# Patient Record
Sex: Female | Born: 1950 | Race: Black or African American | Hispanic: No | Marital: Single | Smoking: Former smoker
Health system: Southern US, Community
[De-identification: ages and names within clinical notes are randomized; demographics above are authoritative.]

## PROBLEM LIST (undated history)

## (undated) DIAGNOSIS — Z91013 Allergy to seafood: Secondary | ICD-10-CM

## (undated) DIAGNOSIS — E0590 Thyrotoxicosis, unspecified without thyrotoxic crisis or storm: Secondary | ICD-10-CM

## (undated) DIAGNOSIS — J309 Allergic rhinitis, unspecified: Secondary | ICD-10-CM

## (undated) DIAGNOSIS — J45909 Unspecified asthma, uncomplicated: Secondary | ICD-10-CM

## (undated) HISTORY — DX: Unspecified asthma, uncomplicated: J45.909

## (undated) HISTORY — DX: Allergic rhinitis, unspecified: J30.9

## (undated) HISTORY — DX: Allergy to seafood: Z91.013

## (undated) HISTORY — DX: Thyrotoxicosis, unspecified without thyrotoxic crisis or storm: E05.90

## (undated) NOTE — *Deleted (*Deleted)
Asthma Continue Advair Diskus 500-50 -1 puff every 12 hours to prevent coughing or wheezing- name brand only, she failed generic Continue montelukast 10 mg once a day to prevent cough or wheeze Continue Spiriva Respimat 1.25 mcg  -2 puffs every morning to prevent cough and wheeze Use Ventolin 2 puffs every 4 hours if needed for wheezing or coughing or instead albuterol 0.083% 1 unit dose every 4 hours if needed Continue Xolair injections 150 mg once every 4 weeks and have access to an epinephrine device If your asthma is to flare you may start prednisone 10 mg taper given at office.  Take 1 tablet twice a day for 4 days then on the fifth day take 1 tablet and stop.  If you use this please call the clinic.  Allergic rhinitis Continue Allegra 60 mg- take one tablet once a day as needed for runny nose Continue Nasonex 1 spray per nostril twice a day if needed for stuffy nose Consider nasal saline rinses as needed for nasal symptoms Continue allergen avoidance measures  Allergic conjunctivitis Continue Optivar eye drops one drop in each eye twice a day as needed for red, itchy eyes.  Reflux Omeprazole 40 mg-take 1 capsule once a day for reflux Continue lifestyle modifications as listed below  Food allergy Continue to avoid shellfish.  If you have an allergic reaction give Benadryl 50 mg every 4 hours and if you has life-threatening symptoms inject with EpiPen 0.3 mg  Hives (urticaria)  . Cetirizine (Zyrtec) 10mg  twice a day and famotidine (Pepcid) 20 mg twice a day. If no symptoms for 7-14 days then decrease to. . Cetirizine (Zyrtec) 10mg  twice a day and famotidine (Pepcid) 20 mg once a day.  If no symptoms for 7-14 days then decrease to. . Cetirizine (Zyrtec) 10mg  twice a day.  If no symptoms for 7-14 days then decrease to. . Cetirizine (Zyrtec) 10mg  once a day.  May use Benadryl (diphenhydramine) as needed for breakthrough hives       If symptoms return, then step up dosage  Keep a  detailed symptom journal including foods eaten, contact with allergens, medications taken, weather changes.   Diabetes Keep track of how often you are using prednisone as this acan change your blood sugar control.   Call us if you are not doing well on this treatment plan  Continue on your other medications as listed in your chart  Follow up in the clinic in  3 months or sooner if needed   Lifestyle Changes for Controlling GERD When you have GERD, stomach acid feels as if it's backing up toward your mouth. Whether or not you take medication to control your GERD, your symptoms can often be improved with lifestyle changes.   Raise Your Head  Reflux is more likely to strike when you're lying down flat, because stomach fluid can  flow backward more easily. Raising the head of your bed 4-6 inches can help. To do this:  Slide blocks or books under the legs at the head of your bed. Or, place a wedge under  the mattress. Many foam stores can make a suitable wedge for you. The wedge  should run from your waist to the top of your head.  Don't just prop your head on several pillows. This increases pressure on your  stomach. It can make GERD worse.  Watch Your Eating Habits Certain foods may increase the acid in your stomach or relax the lower esophageal sphincter, making GERD more likely. It's best to  avoid the following:  Coffee, tea, and carbonated drinks (with and without caffeine)  Fatty, fried, or spicy food  Mint, chocolate, onions, and tomatoes  Any other foods that seem to irritate your stomach or cause you pain  Relieve the Pressure  Eat smaller meals, even if you have to eat more often.  Don't lie down right after you eat. Wait a few hours for your stomach to empty.  Avoid tight belts and tight-fitting clothes.  Lose excess weight.  Tobacco and Alcohol  Avoid smoking tobacco and drinking alcohol. They can make GERD symptoms worse.

---

## 2010-09-27 DEATH — deceased

## 2014-07-17 DIAGNOSIS — Z1211 Encounter for screening for malignant neoplasm of colon: Secondary | ICD-10-CM | POA: Insufficient documentation

## 2014-12-06 ENCOUNTER — Other Ambulatory Visit: Payer: Self-pay

## 2014-12-06 MED ORDER — OMALIZUMAB 150 MG ~~LOC~~ SOLR
150.0000 mg | SUBCUTANEOUS | Status: AC
  Administered 2015-01-20 – 2024-03-27 (×118): 150 mg via SUBCUTANEOUS

## 2015-01-20 ENCOUNTER — Ambulatory Visit (INDEPENDENT_AMBULATORY_CARE_PROVIDER_SITE_OTHER): Payer: Medicare Other | Admitting: *Deleted

## 2015-01-20 DIAGNOSIS — J4540 Moderate persistent asthma, uncomplicated: Secondary | ICD-10-CM

## 2015-02-18 ENCOUNTER — Ambulatory Visit (INDEPENDENT_AMBULATORY_CARE_PROVIDER_SITE_OTHER): Payer: Medicare Other | Admitting: *Deleted

## 2015-02-18 DIAGNOSIS — J4540 Moderate persistent asthma, uncomplicated: Secondary | ICD-10-CM

## 2015-03-18 ENCOUNTER — Ambulatory Visit (INDEPENDENT_AMBULATORY_CARE_PROVIDER_SITE_OTHER): Payer: Medicare Other | Admitting: *Deleted

## 2015-03-18 DIAGNOSIS — J4540 Moderate persistent asthma, uncomplicated: Secondary | ICD-10-CM

## 2015-04-17 ENCOUNTER — Ambulatory Visit (INDEPENDENT_AMBULATORY_CARE_PROVIDER_SITE_OTHER): Payer: Medicare Other | Admitting: *Deleted

## 2015-04-17 DIAGNOSIS — J4540 Moderate persistent asthma, uncomplicated: Secondary | ICD-10-CM | POA: Diagnosis not present

## 2015-05-13 ENCOUNTER — Ambulatory Visit (INDEPENDENT_AMBULATORY_CARE_PROVIDER_SITE_OTHER): Payer: Medicare Other

## 2015-05-13 DIAGNOSIS — J4540 Moderate persistent asthma, uncomplicated: Secondary | ICD-10-CM | POA: Diagnosis not present

## 2015-06-10 ENCOUNTER — Ambulatory Visit (INDEPENDENT_AMBULATORY_CARE_PROVIDER_SITE_OTHER): Payer: Medicare Other

## 2015-06-10 DIAGNOSIS — J4540 Moderate persistent asthma, uncomplicated: Secondary | ICD-10-CM | POA: Diagnosis not present

## 2015-07-08 ENCOUNTER — Ambulatory Visit (INDEPENDENT_AMBULATORY_CARE_PROVIDER_SITE_OTHER): Payer: Medicare Other

## 2015-07-08 DIAGNOSIS — J4540 Moderate persistent asthma, uncomplicated: Secondary | ICD-10-CM

## 2015-08-07 ENCOUNTER — Ambulatory Visit (INDEPENDENT_AMBULATORY_CARE_PROVIDER_SITE_OTHER): Payer: Medicare Other | Admitting: *Deleted

## 2015-08-07 DIAGNOSIS — J4540 Moderate persistent asthma, uncomplicated: Secondary | ICD-10-CM | POA: Diagnosis not present

## 2015-09-09 ENCOUNTER — Ambulatory Visit (INDEPENDENT_AMBULATORY_CARE_PROVIDER_SITE_OTHER): Payer: Medicare Other

## 2015-09-09 DIAGNOSIS — N183 Chronic kidney disease, stage 3 (moderate): Secondary | ICD-10-CM

## 2015-09-09 DIAGNOSIS — J4540 Moderate persistent asthma, uncomplicated: Secondary | ICD-10-CM | POA: Diagnosis not present

## 2015-09-09 DIAGNOSIS — E119 Type 2 diabetes mellitus without complications: Secondary | ICD-10-CM | POA: Insufficient documentation

## 2015-09-09 DIAGNOSIS — E039 Hypothyroidism, unspecified: Secondary | ICD-10-CM | POA: Insufficient documentation

## 2015-09-09 DIAGNOSIS — E1122 Type 2 diabetes mellitus with diabetic chronic kidney disease: Secondary | ICD-10-CM | POA: Insufficient documentation

## 2015-10-01 ENCOUNTER — Other Ambulatory Visit: Payer: Self-pay | Admitting: *Deleted

## 2015-10-01 MED ORDER — MONTELUKAST SODIUM 10 MG PO TABS: 10.0000 mg | ORAL_TABLET | Freq: Every day | ORAL | Status: DC

## 2015-10-07 ENCOUNTER — Ambulatory Visit (INDEPENDENT_AMBULATORY_CARE_PROVIDER_SITE_OTHER): Payer: Medicare Other

## 2015-10-07 DIAGNOSIS — J4540 Moderate persistent asthma, uncomplicated: Secondary | ICD-10-CM | POA: Diagnosis not present

## 2015-11-04 ENCOUNTER — Ambulatory Visit (INDEPENDENT_AMBULATORY_CARE_PROVIDER_SITE_OTHER): Payer: Medicare Other

## 2015-11-04 DIAGNOSIS — J4540 Moderate persistent asthma, uncomplicated: Secondary | ICD-10-CM

## 2015-12-02 ENCOUNTER — Ambulatory Visit (INDEPENDENT_AMBULATORY_CARE_PROVIDER_SITE_OTHER): Payer: Medicare Other

## 2015-12-02 ENCOUNTER — Telehealth: Payer: Self-pay | Admitting: Pediatrics

## 2015-12-02 ENCOUNTER — Other Ambulatory Visit: Payer: Self-pay | Admitting: Allergy

## 2015-12-02 DIAGNOSIS — J4540 Moderate persistent asthma, uncomplicated: Secondary | ICD-10-CM | POA: Diagnosis not present

## 2015-12-02 NOTE — Telephone Encounter (Signed)
TALKED TO PT ABOUT HER BILL

## 2015-12-02 NOTE — Telephone Encounter (Signed)
Patient has a concern about her MM (#[id number]) account balance. Please call her back at your convenience. Thanks

## 2015-12-12 ENCOUNTER — Other Ambulatory Visit: Payer: Self-pay

## 2015-12-12 MED ORDER — MONTELUKAST SODIUM 10 MG PO TABS: 10.0000 mg | ORAL_TABLET | Freq: Every day | ORAL | 0 refills | Status: DC

## 2015-12-12 NOTE — Telephone Encounter (Signed)
[facility], pt needs an OV

## 2015-12-12 NOTE — Telephone Encounter (Signed)
Patient called, has made an appointment for 01/01/16 so we will give her a 30 day supply on montelukast with no refills

## 2015-12-30 ENCOUNTER — Encounter: Payer: Self-pay | Admitting: Pediatrics

## 2015-12-30 ENCOUNTER — Ambulatory Visit: Payer: Medicare Other

## 2015-12-30 ENCOUNTER — Ambulatory Visit (INDEPENDENT_AMBULATORY_CARE_PROVIDER_SITE_OTHER): Payer: Medicare Other

## 2015-12-30 VITALS — BP 126/62 | HR 87 | Temp 97.7°F | Resp 20 | Ht 60.0 in

## 2015-12-30 DIAGNOSIS — J4550 Severe persistent asthma, uncomplicated: Secondary | ICD-10-CM | POA: Diagnosis not present

## 2015-12-30 DIAGNOSIS — J4551 Severe persistent asthma with (acute) exacerbation: Secondary | ICD-10-CM | POA: Insufficient documentation

## 2015-12-30 DIAGNOSIS — J3089 Other allergic rhinitis: Secondary | ICD-10-CM | POA: Insufficient documentation

## 2015-12-30 DIAGNOSIS — K219 Gastro-esophageal reflux disease without esophagitis: Secondary | ICD-10-CM | POA: Insufficient documentation

## 2015-12-30 DIAGNOSIS — J4540 Moderate persistent asthma, uncomplicated: Secondary | ICD-10-CM

## 2015-12-30 MED ORDER — FLUTICASONE-SALMETEROL 500-50 MCG/DOSE IN AEPB: 1.0000 | INHALATION_SPRAY | Freq: Two times a day (BID) | RESPIRATORY_TRACT | 3 refills | Status: DC

## 2015-12-30 MED ORDER — ALBUTEROL SULFATE HFA 108 (90 BASE) MCG/ACT IN AERS: 2.0000 | INHALATION_SPRAY | RESPIRATORY_TRACT | 2 refills | Status: DC | PRN

## 2015-12-30 MED ORDER — TIOTROPIUM BROMIDE MONOHYDRATE 1.25 MCG/ACT IN AERS: 2.0000 | INHALATION_SPRAY | RESPIRATORY_TRACT | 5 refills | Status: DC

## 2015-12-30 MED ORDER — MONTELUKAST SODIUM 10 MG PO TABS
10.0000 mg | ORAL_TABLET | Freq: Every day | ORAL | 3 refills | Status: DC
Start: 2015-12-30 — End: 2017-01-25

## 2015-12-30 MED ORDER — OMEPRAZOLE 40 MG PO CPDR: 40.0000 mg | DELAYED_RELEASE_CAPSULE | Freq: Every day | ORAL | 5 refills | Status: DC

## 2015-12-30 MED ORDER — AZELASTINE HCL 0.05 % OP SOLN: 1.0000 [drp] | Freq: Two times a day (BID) | OPHTHALMIC | 5 refills | Status: DC | PRN

## 2015-12-30 MED ORDER — MOMETASONE FUROATE 50 MCG/ACT NA SUSP: 1.0000 | Freq: Two times a day (BID) | NASAL | 5 refills | Status: DC | PRN

## 2015-12-30 MED ORDER — ALBUTEROL SULFATE (2.5 MG/3ML) 0.083% IN NEBU: 2.5000 mg | INHALATION_SOLUTION | RESPIRATORY_TRACT | 3 refills | Status: DC | PRN

## 2015-12-30 NOTE — Progress Notes (Signed)
[address] [place] [address] Dept: [phone]  FOLLOW UP NOTE  Patient ID: Jennifer Pruitt, female    DOB: 1950/09/20  Age: 65 y.o. MRN: [id number] Date of Office Visit: 12/30/2015  Assessment  Chief Complaint: Asthma and Allergies  HPI Jennifer Pruitt presents for follow-up of asthma and allergic rhinitis.. She is on Xolair every 4 weeks and feels that it has been lifesaving. Her asthma is under control and better than in the past. Her nasal symptoms are under control. Her diabetes is well controlled. Her gastroesophageal reflux is well controlled  Current medications are outlined in the chart and will be summarized in the after visit summary.   Drug Allergies:  Allergies  Allergen Reactions  . Shellfish Allergy   . Methimazole Rash and Swelling    HIVES, caused lips to swell    Physical Exam: BP 126/62   Pulse 87   Temp 97.7 F (36.5 C) (Oral)   Resp 20   Ht 5' (1.524 m)   SpO2 97%    Physical Exam  Constitutional: She is oriented to person, place, and time. She appears well-developed and well-nourished.  HENT:  Eyes normal. Ears normal. Nose normal. Pharynx normal.  Neck: Neck supple.  Cardiovascular:  S1 and S2 normal no murmurs  Pulmonary/Chest:  Clear to percussion auscultation except for some decreased breath sounds at the bases  Lymphadenopathy:    She has no cervical adenopathy.  Neurological: She is alert and oriented to person, place, and time.  Psychiatric: She has a normal mood and affect. Her behavior is normal. Judgment and thought content normal.  Vitals reviewed.   Diagnostics:  FVC 1.33 L FEV1 0.64 L. Predicted FVC 2.01 L predicted FEV1 1.55 L-this shows a mild reduction in the forced vital capacity and a moderate reduction in the FEV1-these values are stable for her  Assessment and Plan: 1. Severe persistent asthma without complication   2. Other allergic rhinitis   3. Gastroesophageal reflux disease without esophagitis     4.      Adult-onset diabetes well controlled  Meds ordered this encounter  Medications  . montelukast (SINGULAIR) 10 MG tablet    Sig: Take 1 tablet (10 mg total) by mouth at bedtime.    Dispense:  90 tablet    Refill:  3  . Fluticasone-Salmeterol (ADVAIR DISKUS) 500-50 MCG/DOSE AEPB    Sig: Inhale 1 puff into the lungs 2 (two) times daily.    Dispense:  3 each    Refill:  3  . albuterol (PROVENTIL) (2.5 MG/3ML) 0.083% nebulizer solution    Sig: Take 3 mLs (2.5 mg total) by nebulization every 4 (four) hours as needed for wheezing or shortness of breath.    Dispense:  75 mL    Refill:  3  . albuterol (VENTOLIN HFA) 108 (90 Base) MCG/ACT inhaler    Sig: Inhale 2 puffs into the lungs every 4 (four) hours as needed for wheezing or shortness of breath.    Dispense:  1 Inhaler    Refill:  2  . mometasone (NASONEX) 50 MCG/ACT nasal spray    Sig: Place 1 spray into the nose 2 (two) times daily as needed.    Dispense:  17 g    Refill:  5  . omeprazole (PRILOSEC) 40 MG capsule    Sig: Take 1 capsule (40 mg total) by mouth daily.    Dispense:  30 capsule    Refill:  5  . azelastine (OPTIVAR) 0.05 %  ophthalmic solution    Sig: Place 1 drop into both eyes 2 (two) times daily as needed.    Dispense:  6 mL    Refill:  5  . Tiotropium Bromide Monohydrate (SPIRIVA RESPIMAT) 1.25 MCG/ACT AERS    Sig: Inhale 2 puffs into the lungs every morning.    Dispense:  1 Inhaler    Refill:  5    Patient Instructions  Zyrtec 10 mg once a day if needed for runny nose Nasonex 1 spray per nostril twice a day if needed for stuffy nose Montelukast  10 mg once a day for coughing or wheezing Advair Diskus 500s/50 one puff every 12 hours for coughing or wheezing Ventolin 2 puffs every 4 hours if needed for wheezing or coughing spells Albuterol 0.083% one unit dose every 4 hours if needed for cough or wheeze Omeprazole 40 mg once a day  for acid reflux Azelastine 0.05% one drop twice a day if needed for  itchy eyes Spiriva Respimat  1.25 mg-2 puffs every morning for coughing or wheezing Continue Xolair every month She should have a flu vaccination Call me if you are not doing well on this treatment plan   Return in about 6 months (around 06/29/2016).    Thank you for the opportunity to care for this patient.  Please do not hesitate to contact me with questions.  Tonette BihariJ. A. Tex Conroy, M.D.  [facility] [address] [place], [place] [address] [phone]

## 2015-12-30 NOTE — Patient Instructions (Addendum)
Zyrtec 10 mg once a day if needed for runny nose Nasonex 1 spray per nostril twice a day if needed for stuffy nose Montelukast  10 mg once a day for coughing or wheezing Advair Diskus 500s/50 one puff every 12 hours for coughing or wheezing Ventolin 2 puffs every 4 hours if needed for wheezing or coughing spells Albuterol 0.083% one unit dose every 4 hours if needed for cough or wheeze Omeprazole 40 mg once a day  for acid reflux Azelastine 0.05% one drop twice a day if needed for itchy eyes Spiriva Respimat  1.25 mg-2 puffs every morning for coughing or wheezing Continue Xolair every month She should have a flu vaccination Call me if you are not doing well on this treatment plan

## 2016-01-27 ENCOUNTER — Ambulatory Visit (INDEPENDENT_AMBULATORY_CARE_PROVIDER_SITE_OTHER): Payer: Medicare Other

## 2016-01-27 DIAGNOSIS — J4540 Moderate persistent asthma, uncomplicated: Secondary | ICD-10-CM

## 2016-02-12 ENCOUNTER — Other Ambulatory Visit: Payer: Self-pay

## 2016-02-12 MED ORDER — ALBUTEROL SULFATE HFA 108 (90 BASE) MCG/ACT IN AERS: 2.0000 | INHALATION_SPRAY | RESPIRATORY_TRACT | 3 refills | Status: DC | PRN

## 2016-02-24 ENCOUNTER — Ambulatory Visit (INDEPENDENT_AMBULATORY_CARE_PROVIDER_SITE_OTHER): Payer: Medicare Other

## 2016-02-24 DIAGNOSIS — J4540 Moderate persistent asthma, uncomplicated: Secondary | ICD-10-CM | POA: Diagnosis not present

## 2016-03-15 ENCOUNTER — Telehealth: Payer: Self-pay

## 2016-03-15 NOTE — Telephone Encounter (Deleted)
Patient would like a letter stating her disability for jury duty. She states that it takes her a little longer to get ready in the morning due to her coughing and wheezing spells. Please call when letter is ready @ [phone]

## 2016-03-15 NOTE — Telephone Encounter (Signed)
Per Damita. Patient came by clinic.  Patient would like a letter stating her disability for jury duty. She states, it takes her longer to get ready in the morning due to her coughing and wheezing spells. Please call when letter is ready at [phone]

## 2016-03-16 NOTE — Telephone Encounter (Signed)
Note typed, Dr. Beaulah DinningBardelas signed and mailed to patient.

## 2016-03-16 NOTE — Telephone Encounter (Signed)
Please type a note as follows  Jennifer Pruitt has severe persistent unstable asthma. She should be excused from jury duty. With any exposure to dust, perfumes, colognes or smoke she develops coughing spells.

## 2016-03-16 NOTE — Telephone Encounter (Signed)
Please type a note as follows  Jennifer Pruitt has severe persistent unstable asthma. She should be excused from jury duty. With any exposure to dust, perfumes, colognes or smoke she develops coughing spells.  

## 2016-03-24 ENCOUNTER — Ambulatory Visit (INDEPENDENT_AMBULATORY_CARE_PROVIDER_SITE_OTHER): Payer: Medicare Other | Admitting: *Deleted

## 2016-03-24 DIAGNOSIS — J4540 Moderate persistent asthma, uncomplicated: Secondary | ICD-10-CM | POA: Diagnosis not present

## 2016-04-20 ENCOUNTER — Ambulatory Visit (INDEPENDENT_AMBULATORY_CARE_PROVIDER_SITE_OTHER): Payer: Medicare Other

## 2016-04-20 DIAGNOSIS — J4540 Moderate persistent asthma, uncomplicated: Secondary | ICD-10-CM | POA: Diagnosis not present

## 2016-05-18 ENCOUNTER — Ambulatory Visit: Payer: Medicare Other

## 2016-05-20 ENCOUNTER — Ambulatory Visit (INDEPENDENT_AMBULATORY_CARE_PROVIDER_SITE_OTHER): Payer: Medicare Other

## 2016-05-20 DIAGNOSIS — J4540 Moderate persistent asthma, uncomplicated: Secondary | ICD-10-CM

## 2016-06-15 ENCOUNTER — Ambulatory Visit (INDEPENDENT_AMBULATORY_CARE_PROVIDER_SITE_OTHER): Payer: Medicare Other

## 2016-06-15 DIAGNOSIS — J4540 Moderate persistent asthma, uncomplicated: Secondary | ICD-10-CM | POA: Diagnosis not present

## 2016-07-13 ENCOUNTER — Ambulatory Visit (INDEPENDENT_AMBULATORY_CARE_PROVIDER_SITE_OTHER): Payer: Medicare Other

## 2016-07-13 DIAGNOSIS — J4540 Moderate persistent asthma, uncomplicated: Secondary | ICD-10-CM

## 2016-08-10 ENCOUNTER — Ambulatory Visit (INDEPENDENT_AMBULATORY_CARE_PROVIDER_SITE_OTHER): Payer: Medicare Other

## 2016-08-10 DIAGNOSIS — J4540 Moderate persistent asthma, uncomplicated: Secondary | ICD-10-CM

## 2016-09-07 ENCOUNTER — Ambulatory Visit: Payer: Self-pay

## 2016-09-07 ENCOUNTER — Ambulatory Visit (INDEPENDENT_AMBULATORY_CARE_PROVIDER_SITE_OTHER): Payer: Medicare Other

## 2016-09-07 DIAGNOSIS — J4540 Moderate persistent asthma, uncomplicated: Secondary | ICD-10-CM

## 2016-10-05 ENCOUNTER — Ambulatory Visit (INDEPENDENT_AMBULATORY_CARE_PROVIDER_SITE_OTHER): Payer: Medicare Other

## 2016-10-05 DIAGNOSIS — J4540 Moderate persistent asthma, uncomplicated: Secondary | ICD-10-CM

## 2016-10-21 ENCOUNTER — Other Ambulatory Visit: Payer: Self-pay | Admitting: *Deleted

## 2016-10-21 MED ORDER — OMALIZUMAB 150 MG ~~LOC~~ SOLR: 150.0000 mg | SUBCUTANEOUS | 11 refills | Status: DC

## 2016-10-26 DIAGNOSIS — E119 Type 2 diabetes mellitus without complications: Secondary | ICD-10-CM | POA: Insufficient documentation

## 2016-10-26 DIAGNOSIS — H40053 Ocular hypertension, bilateral: Secondary | ICD-10-CM | POA: Insufficient documentation

## 2016-11-02 ENCOUNTER — Ambulatory Visit (INDEPENDENT_AMBULATORY_CARE_PROVIDER_SITE_OTHER): Payer: Medicare Other

## 2016-11-02 DIAGNOSIS — J4540 Moderate persistent asthma, uncomplicated: Secondary | ICD-10-CM | POA: Diagnosis not present

## 2016-11-30 ENCOUNTER — Ambulatory Visit: Payer: Self-pay

## 2016-12-03 ENCOUNTER — Ambulatory Visit (INDEPENDENT_AMBULATORY_CARE_PROVIDER_SITE_OTHER): Payer: Medicare Other | Admitting: *Deleted

## 2016-12-03 DIAGNOSIS — J4540 Moderate persistent asthma, uncomplicated: Secondary | ICD-10-CM | POA: Diagnosis not present

## 2016-12-21 DIAGNOSIS — D649 Anemia, unspecified: Secondary | ICD-10-CM | POA: Insufficient documentation

## 2016-12-28 ENCOUNTER — Ambulatory Visit (INDEPENDENT_AMBULATORY_CARE_PROVIDER_SITE_OTHER): Payer: Medicare Other

## 2016-12-28 DIAGNOSIS — J4540 Moderate persistent asthma, uncomplicated: Secondary | ICD-10-CM | POA: Diagnosis not present

## 2017-01-25 ENCOUNTER — Other Ambulatory Visit: Payer: Self-pay | Admitting: Allergy

## 2017-01-25 ENCOUNTER — Other Ambulatory Visit: Payer: Self-pay

## 2017-01-25 ENCOUNTER — Ambulatory Visit (INDEPENDENT_AMBULATORY_CARE_PROVIDER_SITE_OTHER): Payer: Medicare Other | Admitting: *Deleted

## 2017-01-25 DIAGNOSIS — J4540 Moderate persistent asthma, uncomplicated: Secondary | ICD-10-CM | POA: Diagnosis not present

## 2017-01-25 MED ORDER — MONTELUKAST SODIUM 10 MG PO TABS: 10.0000 mg | ORAL_TABLET | Freq: Every day | ORAL | 0 refills | Status: DC

## 2017-01-25 MED ORDER — FLUTICASONE-SALMETEROL 500-50 MCG/DOSE IN AEPB: 1.0000 | INHALATION_SPRAY | Freq: Two times a day (BID) | RESPIRATORY_TRACT | 1 refills | Status: DC

## 2017-01-25 NOTE — Telephone Encounter (Signed)
Declined montelukast 10 mg pt. Needs office visit.

## 2017-02-22 ENCOUNTER — Ambulatory Visit (INDEPENDENT_AMBULATORY_CARE_PROVIDER_SITE_OTHER): Payer: Medicare Other

## 2017-02-22 ENCOUNTER — Other Ambulatory Visit: Payer: Self-pay

## 2017-02-22 ENCOUNTER — Ambulatory Visit (INDEPENDENT_AMBULATORY_CARE_PROVIDER_SITE_OTHER): Payer: Medicare Other | Admitting: Pediatrics

## 2017-02-22 ENCOUNTER — Encounter: Payer: Self-pay | Admitting: Pediatrics

## 2017-02-22 VITALS — BP 138/68 | HR 80 | Temp 97.7°F | Resp 16 | Ht 62.0 in | Wt 208.4 lb

## 2017-02-22 DIAGNOSIS — K219 Gastro-esophageal reflux disease without esophagitis: Secondary | ICD-10-CM | POA: Diagnosis not present

## 2017-02-22 DIAGNOSIS — T7800XD Anaphylactic reaction due to unspecified food, subsequent encounter: Secondary | ICD-10-CM

## 2017-02-22 DIAGNOSIS — J4550 Severe persistent asthma, uncomplicated: Secondary | ICD-10-CM

## 2017-02-22 DIAGNOSIS — I10 Essential (primary) hypertension: Secondary | ICD-10-CM | POA: Diagnosis not present

## 2017-02-22 DIAGNOSIS — J4540 Moderate persistent asthma, uncomplicated: Secondary | ICD-10-CM | POA: Diagnosis not present

## 2017-02-22 DIAGNOSIS — T7800XA Anaphylactic reaction due to unspecified food, initial encounter: Secondary | ICD-10-CM | POA: Insufficient documentation

## 2017-02-22 DIAGNOSIS — J3089 Other allergic rhinitis: Secondary | ICD-10-CM

## 2017-02-22 MED ORDER — FLUTICASONE-SALMETEROL 500-50 MCG/DOSE IN AEPB: 1.0000 | INHALATION_SPRAY | Freq: Two times a day (BID) | RESPIRATORY_TRACT | 1 refills | Status: DC

## 2017-02-22 MED ORDER — MOMETASONE FUROATE 50 MCG/ACT NA SUSP: 1.0000 | Freq: Two times a day (BID) | NASAL | 5 refills | Status: DC | PRN

## 2017-02-22 MED ORDER — OMEPRAZOLE 40 MG PO CPDR: DELAYED_RELEASE_CAPSULE | ORAL | 5 refills | Status: DC

## 2017-02-22 MED ORDER — AZELASTINE HCL 0.05 % OP SOLN: 1.0000 [drp] | Freq: Two times a day (BID) | OPHTHALMIC | 5 refills | Status: DC | PRN

## 2017-02-22 MED ORDER — MONTELUKAST SODIUM 10 MG PO TABS: 10.0000 mg | ORAL_TABLET | Freq: Every day | ORAL | 3 refills | Status: DC

## 2017-02-22 MED ORDER — MONTELUKAST SODIUM 10 MG PO TABS: 10.0000 mg | ORAL_TABLET | Freq: Every day | ORAL | 0 refills | Status: DC

## 2017-02-22 MED ORDER — TIOTROPIUM BROMIDE MONOHYDRATE 1.25 MCG/ACT IN AERS: 2.0000 | INHALATION_SPRAY | RESPIRATORY_TRACT | 5 refills | Status: DC

## 2017-02-22 MED ORDER — ALBUTEROL SULFATE HFA 108 (90 BASE) MCG/ACT IN AERS: 2.0000 | INHALATION_SPRAY | RESPIRATORY_TRACT | 3 refills | Status: DC | PRN

## 2017-02-22 MED ORDER — EPINEPHRINE 0.3 MG/0.3ML IJ SOAJ: 0.3000 mg | Freq: Once | INTRAMUSCULAR | 1 refills | Status: AC

## 2017-02-22 NOTE — Patient Instructions (Addendum)
Zyrtec 10 mg-take 1 tablet once a day if needed for runny nose Nasonex 1 spray per nostril twice a day if needed for stuffy nose Montelukast  10 mg once a day for coughing or wheezing Advair Diskus 500/50-1 puff every 12 hours for coughing or wheezing Ventolin 2 puffs every 4 hours if needed for wheezing or coughing spells or instead albuterol 0.083% one unit dose every 4 hours if needed Omeprazole 40 mg once a day for acid reflux Azelastine 0.05% one drop twice a day if needed for itchy eyes Spiriva Respimat  1.25 mg-2 puffs every morning to help her breathing Continue Xolair every month every 4 weeks Continue on your other medications Continue avoiding shellfish and have Benadryl and EpiPen available in case of an allergic reaction

## 2017-02-22 NOTE — Progress Notes (Signed)
[address] [place] [address] Dept: [phone]  FOLLOW UP NOTE  Patient ID: Jennifer Pruitt, female    DOB: 1950/06/04  Age: 66 y.o. MRN: [id number] Date of Office Visit: 02/22/2017  Assessment  Chief Complaint: Asthma  HPI Jennifer Pruitt presents for follow-up of asthma and allergic rhinitis. Her asthma has been well controlled. She feels that Xolair every 4 weeks has been lifesaving. Her nasal symptoms are well controlled. Her diabetes is well controlled. Her gastroesophageal reflux is well controlled. She continues to avoid shellfish  Current medications are outlined in the chart.   Drug Allergies:  Allergies  Allergen Reactions  . Shellfish Allergy   . Methimazole Rash and Swelling    HIVES, caused lips to swell    Physical Exam: BP 138/68   Pulse 80   Temp 97.7 F (36.5 C) (Oral)   Resp 16   Ht 5\' 2"  (1.575 m)   Wt 208 lb 6.4 oz (94.5 kg)   SpO2 96%   BMI 38.12 kg/m    Physical Exam  Constitutional: She is oriented to person, place, and time. She appears well-developed and well-nourished.  HENT:  Eyes normal. Ears normal. Nose normal. Pharynx normal.  Neck: Neck supple.  Cardiovascular:  S1 and S2 normal no murmurs  Pulmonary/Chest:  She had an increased anteroposterior diameter. Her lungs were clear to percussion and auscultation except for some decreased breath sounds at the bases  Lymphadenopathy:    She has no cervical adenopathy.  Neurological: She is alert and oriented to person, place, and time.  Psychiatric: She has a normal mood and affect. Her behavior is normal. Judgment and thought content normal.  Vitals reviewed.   Diagnostics:  FVC 1.28 L FEV1 0.67 L. Predicted FVC 2.19 L predicted FEV1 1.70 L-this shows a moderate reduction in the forced vital capacity and FEV1  Assessment and Plan: 1. Severe persistent asthma without complication   2. Gastroesophageal reflux disease without esophagitis   3. Other allergic rhinitis     4. Essential hypertension   5. Anaphylactic shock due to food, subsequent encounter     Meds ordered this encounter  Medications  . albuterol (PROAIR HFA) 108 (90 Base) MCG/ACT inhaler    Sig: Inhale 2 puffs into the lungs every 4 (four) hours as needed for wheezing or shortness of breath.    Dispense:  1 Inhaler    Refill:  3  . azelastine (OPTIVAR) 0.05 % ophthalmic solution    Sig: Place 1 drop into both eyes 2 (two) times daily as needed.    Dispense:  6 mL    Refill:  5  . Fluticasone-Salmeterol (ADVAIR DISKUS) 500-50 MCG/DOSE AEPB    Sig: Inhale 1 puff into the lungs 2 (two) times daily.    Dispense:  1 each    Refill:  1  . mometasone (NASONEX) 50 MCG/ACT nasal spray    Sig: Place 1 spray into the nose 2 (two) times daily as needed.    Dispense:  17 g    Refill:  5  . montelukast (SINGULAIR) 10 MG tablet    Sig: Take 1 tablet (10 mg total) by mouth at bedtime.    Dispense:  30 tablet    Refill:  0    No additional refills until pt apt  . Tiotropium Bromide Monohydrate (SPIRIVA RESPIMAT) 1.25 MCG/ACT AERS    Sig: Inhale 2 puffs into the lungs every morning.    Dispense:  1 Inhaler    Refill:  5  . EPINEPHrine 0.3 mg/0.3 mL IJ SOAJ injection    Sig: Inject 0.3 mLs (0.3 mg total) into the muscle once for 1 dose.    Dispense:  0.3 mL    Refill:  1  . omeprazole (PRILOSEC) 40 MG capsule    Sig: 40mg  once a day for acid refluz    Dispense:  30 capsule    Refill:  5    Patient Instructions  Zyrtec 10 mg-take 1 tablet once a day if needed for runny nose Nasonex 1 spray per nostril twice a day if needed for stuffy nose Montelukast  10 mg once a day for coughing or wheezing Advair Diskus 500/50-1 puff every 12 hours for coughing or wheezing Ventolin 2 puffs every 4 hours if needed for wheezing or coughing spells or instead albuterol 0.083% one unit dose every 4 hours if needed Omeprazole 40 mg once a day for acid reflux Azelastine 0.05% one drop twice a day if needed  for itchy eyes Spiriva Respimat  1.25 mg-2 puffs every morning to help her breathing Continue Xolair every month every 4 weeks Continue on your other medications Continue avoiding shellfish and have Benadryl and EpiPen available in case of an allergic reaction   Return in about 6 months (around 08/22/2017).    Thank you for the opportunity to care for this patient.  Please do not hesitate to contact me with questions.  Tonette BihariJ. A. Shekita Boyden, M.D.  [facility] [address] [place], [place] [address] [phone]

## 2017-02-24 ENCOUNTER — Telehealth: Payer: Self-pay | Admitting: Allergy and Immunology

## 2017-02-24 MED ORDER — MONTELUKAST SODIUM 10 MG PO TABS: 10.0000 mg | ORAL_TABLET | Freq: Every day | ORAL | 3 refills | Status: DC

## 2017-02-24 NOTE — Telephone Encounter (Addendum)
Wants 90 days supply on montelukast 10 mg with 4 refills.  Ok per Dr. Beaulah DinningBardelas. Sent in rx.  Patient notified.

## 2017-02-24 NOTE — Telephone Encounter (Signed)
Please call patient back regarding her meds that were sent to pharmacy.

## 2017-03-24 ENCOUNTER — Ambulatory Visit (INDEPENDENT_AMBULATORY_CARE_PROVIDER_SITE_OTHER): Payer: Medicare Other | Admitting: *Deleted

## 2017-03-24 DIAGNOSIS — J4540 Moderate persistent asthma, uncomplicated: Secondary | ICD-10-CM | POA: Diagnosis not present

## 2017-03-25 ENCOUNTER — Ambulatory Visit: Payer: Self-pay

## 2017-04-19 ENCOUNTER — Ambulatory Visit (INDEPENDENT_AMBULATORY_CARE_PROVIDER_SITE_OTHER): Payer: Medicare Other

## 2017-04-19 DIAGNOSIS — J4540 Moderate persistent asthma, uncomplicated: Secondary | ICD-10-CM | POA: Diagnosis not present

## 2017-05-17 ENCOUNTER — Ambulatory Visit (INDEPENDENT_AMBULATORY_CARE_PROVIDER_SITE_OTHER): Payer: Medicare Other

## 2017-05-17 DIAGNOSIS — J4540 Moderate persistent asthma, uncomplicated: Secondary | ICD-10-CM | POA: Diagnosis not present

## 2017-06-14 ENCOUNTER — Ambulatory Visit: Payer: Medicare Other

## 2017-06-16 ENCOUNTER — Ambulatory Visit: Payer: Self-pay

## 2017-06-17 ENCOUNTER — Ambulatory Visit (INDEPENDENT_AMBULATORY_CARE_PROVIDER_SITE_OTHER): Payer: Medicare Other | Admitting: *Deleted

## 2017-06-17 DIAGNOSIS — J4550 Severe persistent asthma, uncomplicated: Secondary | ICD-10-CM

## 2017-07-12 ENCOUNTER — Ambulatory Visit (INDEPENDENT_AMBULATORY_CARE_PROVIDER_SITE_OTHER): Payer: Medicare Other

## 2017-07-12 DIAGNOSIS — J4540 Moderate persistent asthma, uncomplicated: Secondary | ICD-10-CM | POA: Diagnosis not present

## 2017-08-09 ENCOUNTER — Ambulatory Visit (INDEPENDENT_AMBULATORY_CARE_PROVIDER_SITE_OTHER): Payer: Medicare Other

## 2017-08-09 DIAGNOSIS — J4540 Moderate persistent asthma, uncomplicated: Secondary | ICD-10-CM | POA: Diagnosis not present

## 2017-08-23 ENCOUNTER — Ambulatory Visit (INDEPENDENT_AMBULATORY_CARE_PROVIDER_SITE_OTHER): Payer: Medicare Other | Admitting: Pediatrics

## 2017-08-23 ENCOUNTER — Encounter: Payer: Self-pay | Admitting: Pediatrics

## 2017-08-23 VITALS — BP 134/64 | HR 88 | Temp 97.8°F | Resp 20 | Ht 62.0 in | Wt 206.0 lb

## 2017-08-23 DIAGNOSIS — J4550 Severe persistent asthma, uncomplicated: Secondary | ICD-10-CM

## 2017-08-23 DIAGNOSIS — J3089 Other allergic rhinitis: Secondary | ICD-10-CM

## 2017-08-23 DIAGNOSIS — T7800XD Anaphylactic reaction due to unspecified food, subsequent encounter: Secondary | ICD-10-CM

## 2017-08-23 DIAGNOSIS — K219 Gastro-esophageal reflux disease without esophagitis: Secondary | ICD-10-CM | POA: Diagnosis not present

## 2017-08-23 DIAGNOSIS — I10 Essential (primary) hypertension: Secondary | ICD-10-CM

## 2017-08-23 MED ORDER — MONTELUKAST SODIUM 10 MG PO TABS: 10.0000 mg | ORAL_TABLET | Freq: Every day | ORAL | 3 refills | Status: DC

## 2017-08-23 MED ORDER — TIOTROPIUM BROMIDE MONOHYDRATE 1.25 MCG/ACT IN AERS: 2.0000 | INHALATION_SPRAY | RESPIRATORY_TRACT | 2 refills | Status: DC

## 2017-08-23 MED ORDER — ALBUTEROL SULFATE HFA 108 (90 BASE) MCG/ACT IN AERS: 2.0000 | INHALATION_SPRAY | RESPIRATORY_TRACT | 1 refills | Status: DC | PRN

## 2017-08-23 MED ORDER — FLUTICASONE-SALMETEROL 500-50 MCG/DOSE IN AEPB: INHALATION_SPRAY | RESPIRATORY_TRACT | 2 refills | Status: DC

## 2017-08-23 NOTE — Progress Notes (Signed)
[address] [place] [address] Dept: [phone]  FOLLOW UP NOTE  Patient ID: Jennifer Pruitt, female    DOB: [birth year]  Age: 67 y.o. MRN: [id number] Date of Office Visit: 08/23/2017  Assessment  Chief Complaint: Wheezing; Breathing Problem (hot weather making breathing difficult); and Medication Refill  HPI Jennifer Pruitt presents for follow-up of asthma and allergic rhinitis. She is on Xolair every 4 weeks and feels that it has been lifesaving.Her nasal symptoms are controlled. Her diabetes is well controlled She continues to avoid shellfish.  Gastroesophageal reflux is well controlled. She has some shortness of breath when it  is hot and humid outside like it has been in the past few days.   Drug Allergies:  Allergies  Allergen Reactions  . Tapazole [Methimazole] Swelling and Rash    HIVES, caused lips to swell  . Iodine Hives and Swelling  . Shellfish Allergy     Physical Exam: BP 134/64 (BP Location: Left Arm, Patient Position: Sitting, Cuff Size: Large)   Pulse 88   Temp 97.8 F (36.6 C) (Oral)   Resp 20   Ht  (1.575 m)   Wt 206 lb (93.4 kg)   SpO2 95%   BMI 37.68 kg/m    Physical Exam  Constitutional: She is oriented to person, place, and time. She appears well-developed and well-nourished.  HENT:  Eyes normal. Ears normal. Nose normal. Pharynx normal.  Neck: Neck supple.  Pulmonary/Chest:  Increased anteroposterior diameter. Clear to percussion and auscultation except for some decreased breath sounds at the bases  Lymphadenopathy:    She has no cervical adenopathy.  Neurological: She is alert and oriented to person, place, and time.  Psychiatric: She has a normal mood and affect. Her behavior is normal. Judgment and thought content normal.  Vitals reviewed.   Diagnostics:  FVC 1.51 L FEV1 0.80 L. Predicted FVC 2.19 L predicted FEV1 1.70 L-This shows a mild reduction in the forced vital capacity and a moderate reduction in the  FEV1  Assessment and Plan: 1. Severe persistent asthma, unspecified whether complicated   2. Anaphylactic shock due to food, subsequent encounter   3. Gastroesophageal reflux disease without esophagitis   4. Essential hypertension   5. Other allergic rhinitis   6 .    Type 2 diabetes with no insulin need  Meds ordered this encounter  Medications  . Fluticasone-Salmeterol (ADVAIR DISKUS) 500-50 MCG/DOSE AEPB    Sig: One puff every 12 hours to prevent cough or wheeze. Rinse, gargle and spit after use.    Dispense:  3 each    Refill:  2    Dispense 90 days supply  . albuterol (PROAIR HFA) 108 (90 Base) MCG/ACT inhaler    Sig: Inhale 2 puffs into the lungs every 4 (four) hours as needed for wheezing or shortness of breath.    Dispense:  3 Inhaler    Refill:  1    Dispense 90 days supply  . Tiotropium Bromide Monohydrate (SPIRIVA RESPIMAT) 1.25 MCG/ACT AERS    Sig: Inhale 2 puffs into the lungs every morning.    Dispense:  3 Inhaler    Refill:  2    Dispense 90 days supply  . montelukast (SINGULAIR) 10 MG tablet    Sig: Take 1 tablet (10 mg total) by mouth at bedtime.    Dispense:  90 tablet    Refill:  3    Dispense 90 days supply.    Patient Instructions  Zyrtec 10 mg-take 1  tablet once a day if needed for runny nose Nasonex 1 spray per nostril twice a day if needed for stuffy nose Montelukast 10 mg-take 1 tablet once a day to prevent coughing or wheezing Advair Diskus 500/50-1 puff every 12 hours to prevent coughing or wheezing Ventolin 2 puffs every 4 hours if needed for wheezing or coughing spells or instead albuterol 0.083% one unit dose every 4 hours if needed Omeprazole 40 mg once a day for acid reflux Azelastine's 0.05% one drop twice a day if needed for itchy eyes Spiriva Respimat 1.25 mg - 2 puffs every morning to help with her breathing Continue Xolair every 4 weeks CBC with differential to see how much eosinophilia she has Continue on your other medications Call  me if you're not doing well on this treatment plan  Continue avoiding shellfish. If you have an allergic reaction take Benadryl 50 mg every 4 hours and if you have life-threatening symptoms inject  with EpiPen 0.3 mg   Return in about 6 months (around 02/23/2018).    Thank you for the opportunity to care for this patient.  Please do not hesitate to contact me with questions.  Tonette Bihari, M.D.  [facility] [address] [place], [place] [address] [phone]

## 2017-08-23 NOTE — Patient Instructions (Addendum)
Zyrtec 10 mg-take 1 tablet once a day if needed for runny nose Nasonex 1 spray per nostril twice a day if needed for stuffy nose Montelukast 10 mg-take 1 tablet once a day to prevent coughing or wheezing Advair Diskus 500/50-1 puff every 12 hours to prevent coughing or wheezing Ventolin 2 puffs every 4 hours if needed for wheezing or coughing spells or instead albuterol 0.083% one unit dose every 4 hours if needed Omeprazole 40 mg once a day for acid reflux Azelastine's 0.05% one drop twice a day if needed for itchy eyes Spiriva Respimat 1.25 mg - 2 puffs every morning to help with her breathing Continue Xolair every 4 weeks CBC with differential to see how much eosinophilia she has Continue on your other medications Call me if you're not doing well on this treatment plan  Continue avoiding shellfish. If you have an allergic reaction take Benadryl 50 mg every 4 hours and if you have life-threatening symptoms inject  with EpiPen 0.3 mg

## 2017-08-26 LAB — CBC WITH DIFFERENTIAL/PLATELET
Basophils Absolute: 0 10*3/uL (ref 0.0–0.2)
Basos: 0 %
EOS (ABSOLUTE): 0 10*3/uL (ref 0.0–0.4)
Eos: 0 %
Hematocrit: 34 % (ref 34.0–46.6)
Hemoglobin: 10.7 g/dL — ABNORMAL LOW (ref 11.1–15.9)
IMMATURE GRANS (ABS): 0 10*3/uL (ref 0.0–0.1)
IMMATURE GRANULOCYTES: 0 %
LYMPHS: 47 %
Lymphocytes Absolute: 2.4 10*3/uL (ref 0.7–3.1)
MCH: 25.1 pg — ABNORMAL LOW (ref 26.6–33.0)
MCHC: 31.5 g/dL (ref 31.5–35.7)
MCV: 80 fL (ref 79–97)
Monocytes Absolute: 0.3 10*3/uL (ref 0.1–0.9)
Monocytes: 5 %
Neutrophils Absolute: 2.4 10*3/uL (ref 1.4–7.0)
Neutrophils: 48 %
PLATELETS: 279 10*3/uL (ref 150–450)
RBC: 4.26 x10E6/uL (ref 3.77–5.28)
RDW: 17 % — ABNORMAL HIGH (ref 12.3–15.4)
WBC: 5 10*3/uL (ref 3.4–10.8)

## 2017-09-06 ENCOUNTER — Ambulatory Visit (INDEPENDENT_AMBULATORY_CARE_PROVIDER_SITE_OTHER): Payer: Medicare Other

## 2017-09-06 DIAGNOSIS — J4540 Moderate persistent asthma, uncomplicated: Secondary | ICD-10-CM

## 2017-10-04 ENCOUNTER — Ambulatory Visit (INDEPENDENT_AMBULATORY_CARE_PROVIDER_SITE_OTHER): Payer: Medicare Other

## 2017-10-04 DIAGNOSIS — J4540 Moderate persistent asthma, uncomplicated: Secondary | ICD-10-CM

## 2017-10-19 ENCOUNTER — Other Ambulatory Visit: Payer: Self-pay | Admitting: *Deleted

## 2017-10-19 MED ORDER — OMALIZUMAB 150 MG ~~LOC~~ SOLR: 150.0000 mg | SUBCUTANEOUS | 11 refills | Status: DC

## 2017-11-01 ENCOUNTER — Ambulatory Visit (INDEPENDENT_AMBULATORY_CARE_PROVIDER_SITE_OTHER): Payer: Medicare Other

## 2017-11-01 DIAGNOSIS — J4540 Moderate persistent asthma, uncomplicated: Secondary | ICD-10-CM

## 2017-11-29 ENCOUNTER — Ambulatory Visit (INDEPENDENT_AMBULATORY_CARE_PROVIDER_SITE_OTHER): Payer: Medicare Other | Admitting: *Deleted

## 2017-11-29 DIAGNOSIS — J4550 Severe persistent asthma, uncomplicated: Secondary | ICD-10-CM

## 2017-12-27 ENCOUNTER — Ambulatory Visit (INDEPENDENT_AMBULATORY_CARE_PROVIDER_SITE_OTHER): Payer: Medicare Other

## 2017-12-27 DIAGNOSIS — J4540 Moderate persistent asthma, uncomplicated: Secondary | ICD-10-CM

## 2018-01-30 ENCOUNTER — Encounter: Payer: Self-pay | Admitting: Pediatrics

## 2018-01-30 ENCOUNTER — Ambulatory Visit: Payer: Medicare Other | Admitting: Pediatrics

## 2018-01-30 ENCOUNTER — Ambulatory Visit: Payer: Medicare Other

## 2018-01-30 VITALS — BP 110/70 | HR 92 | Temp 97.8°F | Resp 18

## 2018-01-30 DIAGNOSIS — J4550 Severe persistent asthma, uncomplicated: Secondary | ICD-10-CM | POA: Diagnosis not present

## 2018-01-30 DIAGNOSIS — J3089 Other allergic rhinitis: Secondary | ICD-10-CM | POA: Diagnosis not present

## 2018-01-30 DIAGNOSIS — T7800XD Anaphylactic reaction due to unspecified food, subsequent encounter: Secondary | ICD-10-CM

## 2018-01-30 DIAGNOSIS — I10 Essential (primary) hypertension: Secondary | ICD-10-CM

## 2018-01-30 DIAGNOSIS — J4540 Moderate persistent asthma, uncomplicated: Secondary | ICD-10-CM

## 2018-01-30 DIAGNOSIS — K219 Gastro-esophageal reflux disease without esophagitis: Secondary | ICD-10-CM | POA: Diagnosis not present

## 2018-01-30 MED ORDER — FLUTICASONE-SALMETEROL 500-50 MCG/DOSE IN AEPB: INHALATION_SPRAY | RESPIRATORY_TRACT | 2 refills | Status: DC

## 2018-01-30 MED ORDER — TIOTROPIUM BROMIDE MONOHYDRATE 1.25 MCG/ACT IN AERS: 2.0000 | INHALATION_SPRAY | RESPIRATORY_TRACT | 2 refills | Status: DC

## 2018-01-30 MED ORDER — MONTELUKAST SODIUM 10 MG PO TABS: 10.0000 mg | ORAL_TABLET | Freq: Every day | ORAL | 3 refills | Status: DC

## 2018-01-30 MED ORDER — MOMETASONE FUROATE 50 MCG/ACT NA SUSP: 1.0000 | Freq: Two times a day (BID) | NASAL | 5 refills | Status: DC | PRN

## 2018-01-30 MED ORDER — ALBUTEROL SULFATE HFA 108 (90 BASE) MCG/ACT IN AERS: 2.0000 | INHALATION_SPRAY | RESPIRATORY_TRACT | 1 refills | Status: DC | PRN

## 2018-01-30 MED ORDER — OMEPRAZOLE 40 MG PO CPDR
DELAYED_RELEASE_CAPSULE | ORAL | 5 refills | Status: DC
Start: 2018-01-30 — End: 2019-02-21

## 2018-01-30 NOTE — Progress Notes (Signed)
[address] [place] [address] Dept: [phone]  FOLLOW UP NOTE  Patient ID: Jennifer Pruitt, female    DOB: 02/11/1951  Age: 67 y.o. MRN: [id number] Date of Office Visit: 01/30/2018  Assessment  Chief Complaint: Asthma  HPI STEVEN VEAZIE presents for follow-up of severe persistent asthma. ,  allergic rhinitis and food allergies.  She continues to avoid shellfish.  Her diabetes is well controlled without insulin For the past month she tried generic Advair Diskus 500/50 and her asthma has not been well controlled.  She is having to use more albuterol.  She is on Spiriva Respimat 1.25-2 puffs every 24  hours and montelukast 10 mg once a day.  She continues to use omeprazole 40 mg once a day and this is keeping her heartburn under control .  She did not have any eosinophils noted on her last CBC with differential Other current medications are outlined in the chart   Drug Allergies:  Allergies  Allergen Reactions  . Tapazole [Methimazole] Swelling and Rash    HIVES, caused lips to swell  . Iodine Hives and Swelling  . Shellfish Allergy   . Wixela Inhub [Fluticasone-Salmeterol]     Failed trial of    Physical Exam: BP 110/70   Pulse 92   Temp 97.8 F (36.6 C) (Oral)   Resp 18   SpO2 95%    Physical Exam  Constitutional: She is oriented to person, place, and time. She appears well-developed and well-nourished.  HENT:  Eyes normal.  Ears normal.  Nose normal.  Pharynx normal.  Neck: Neck supple.  Cardiovascular:  S1-S2 normal no murmurs  Pulmonary/Chest:  Increased anteroposterior diameter.  Decreased breath sounds at the bases  Lymphadenopathy:    She has no cervical adenopathy.  Neurological: She is alert and oriented to person, place, and time.  Psychiatric: She has a normal mood and affect. Her behavior is normal. Judgment and thought content normal.  Vitals reviewed.   Diagnostics: FVC 1.20 L FEV1 0.68 L.  Predicted FVC 2.16 L predicted FEV1 1.67  L-this shows a moderate reduction in the forced vital capacity and the FEV1 .  Her best FVC has been around 1.50 L and her best FEV1 around 0.80 L  Assessment and Plan: 1. Severe persistent asthma without complication   2. Anaphylactic shock due to food, subsequent encounter   3. Gastroesophageal reflux disease without esophagitis   4. Other allergic rhinitis   5. Essential hypertension     Meds ordered this encounter  Medications  . albuterol (PROAIR HFA) 108 (90 Base) MCG/ACT inhaler    Sig: Inhale 2 puffs into the lungs every 4 (four) hours as needed for wheezing or shortness of breath.    Dispense:  3 Inhaler    Refill:  1    Dispense 90 days supply  . Fluticasone-Salmeterol (ADVAIR DISKUS) 500-50 MCG/DOSE AEPB    Sig: One puff every 12 hours to prevent cough or wheeze. Rinse, gargle and spit after use.    Dispense:  3 each    Refill:  2    Dispense 90 days supply BRAND ONLY  . mometasone (NASONEX) 50 MCG/ACT nasal spray    Sig: Place 1 spray into the nose 2 (two) times daily as needed.    Dispense:  17 g    Refill:  5  . montelukast (SINGULAIR) 10 MG tablet    Sig: Take 1 tablet (10 mg total) by mouth at bedtime.    Dispense:  90  tablet    Refill:  3    Dispense 90 days supply.  Marland Kitchen omeprazole (PRILOSEC) 40 MG capsule    Sig: 40mg  once a day for acid refluz    Dispense:  30 capsule    Refill:  5  . Tiotropium Bromide Monohydrate (SPIRIVA RESPIMAT) 1.25 MCG/ACT AERS    Sig: Inhale 2 puffs into the lungs every morning.    Dispense:  3 Inhaler    Refill:  2    Dispense 90 days supply    Patient Instructions  Advair Diskus 500-50 -1 puff every 12 hours to prevent coughing or wheezing- name brand only, she failed generic Ventolin 2 puffs every 4 hours if needed for wheezing or coughing spells or instead albuterol 0.083% 1 unit dose every 4 hours if needed Spiriva Respimat 1.25 mcg  -2 puffs every morning to help with her breathing Continue Xolair every 4 weeks Zyrtec 10  mg-take 1 tablet once a day if needed for runny nose Nasonex 1 spray per nostril twice a day if needed for stuffy nose Omeprazole 40 mg-take 1 capsule once a day for reflux Call us if you are not doing well on this treatment plan Continue on your other medications  Avoid shellfish.  If she has an allergic reaction give Benadryl 50 mg every 4 hours and if she has life-threatening symptoms inject with EpiPen 0.3 mg   Return in about 6 months (around 07/31/2018).    Thank you for the opportunity to care for this patient.  Please do not hesitate to contact me with questions.  Tonette Bihari, M.D.  [facility] [address] [place], [place] [address] [phone]

## 2018-01-30 NOTE — Patient Instructions (Addendum)
Advair Diskus 500-50 -1 puff every 12 hours to prevent coughing or wheezing- name brand only, she failed generic Ventolin 2 puffs every 4 hours if needed for wheezing or coughing spells or instead albuterol 0.083% 1 unit dose every 4 hours if needed Spiriva Respimat 1.25 mcg  -2 puffs every morning to help with her breathing Continue Xolair every 4 weeks Zyrtec 10 mg-take 1 tablet once a day if needed for runny nose Nasonex 1 spray per nostril twice a day if needed for stuffy nose Omeprazole 40 mg-take 1 capsule once a day for reflux Call us if you are not doing well on this treatment plan Continue on your other medications  Avoid shellfish.  If she has an allergic reaction give Benadryl 50 mg every 4 hours and if she has life-threatening symptoms inject with EpiPen 0.3 mg

## 2018-02-28 ENCOUNTER — Ambulatory Visit (INDEPENDENT_AMBULATORY_CARE_PROVIDER_SITE_OTHER): Payer: Medicare Other

## 2018-02-28 DIAGNOSIS — J4540 Moderate persistent asthma, uncomplicated: Secondary | ICD-10-CM

## 2018-03-28 ENCOUNTER — Ambulatory Visit (INDEPENDENT_AMBULATORY_CARE_PROVIDER_SITE_OTHER): Payer: Medicare Other

## 2018-03-28 DIAGNOSIS — J4540 Moderate persistent asthma, uncomplicated: Secondary | ICD-10-CM | POA: Diagnosis not present

## 2018-04-25 ENCOUNTER — Ambulatory Visit (INDEPENDENT_AMBULATORY_CARE_PROVIDER_SITE_OTHER): Payer: Medicare Other

## 2018-04-25 DIAGNOSIS — J4540 Moderate persistent asthma, uncomplicated: Secondary | ICD-10-CM

## 2018-05-23 ENCOUNTER — Ambulatory Visit (INDEPENDENT_AMBULATORY_CARE_PROVIDER_SITE_OTHER): Payer: Medicare Other

## 2018-05-23 ENCOUNTER — Telehealth: Payer: Self-pay

## 2018-05-23 DIAGNOSIS — J4540 Moderate persistent asthma, uncomplicated: Secondary | ICD-10-CM

## 2018-05-23 NOTE — Telephone Encounter (Signed)
Pt. Was in to receive her xolair injection today. Pt. Stated she had her appointment back in November and that Dr. Beaulah Dinning usually gives her a baby pak to keep on hand for when there is weather changes that sometimes can trigger her asthma so I asked Dr. Beaulah Dinning and he ok'd to give pt. A baby pak.

## 2018-05-24 NOTE — Telephone Encounter (Signed)
Okay to give to the patient

## 2018-06-20 ENCOUNTER — Ambulatory Visit: Payer: Self-pay

## 2018-06-20 ENCOUNTER — Ambulatory Visit (INDEPENDENT_AMBULATORY_CARE_PROVIDER_SITE_OTHER): Payer: Medicare Other

## 2018-06-20 DIAGNOSIS — J4540 Moderate persistent asthma, uncomplicated: Secondary | ICD-10-CM

## 2018-07-03 ENCOUNTER — Encounter: Payer: Self-pay | Admitting: Family Medicine

## 2018-07-03 ENCOUNTER — Ambulatory Visit (INDEPENDENT_AMBULATORY_CARE_PROVIDER_SITE_OTHER): Payer: Medicare Other | Admitting: Family Medicine

## 2018-07-03 ENCOUNTER — Other Ambulatory Visit: Payer: Self-pay

## 2018-07-03 DIAGNOSIS — K219 Gastro-esophageal reflux disease without esophagitis: Secondary | ICD-10-CM | POA: Diagnosis not present

## 2018-07-03 DIAGNOSIS — J3089 Other allergic rhinitis: Secondary | ICD-10-CM | POA: Diagnosis not present

## 2018-07-03 DIAGNOSIS — T7800XD Anaphylactic reaction due to unspecified food, subsequent encounter: Secondary | ICD-10-CM | POA: Diagnosis not present

## 2018-07-03 DIAGNOSIS — H1010 Acute atopic conjunctivitis, unspecified eye: Secondary | ICD-10-CM

## 2018-07-03 DIAGNOSIS — J4551 Severe persistent asthma with (acute) exacerbation: Secondary | ICD-10-CM

## 2018-07-03 MED ORDER — PREDNISONE 10 MG PO TABS: ORAL_TABLET | ORAL | 0 refills | Status: DC

## 2018-07-03 MED ORDER — CETIRIZINE HCL 10 MG PO TABS: 10.0000 mg | ORAL_TABLET | Freq: Every day | ORAL | 3 refills | Status: DC

## 2018-07-03 NOTE — Progress Notes (Signed)
RE: Jennifer Pruitt MRN: [id number] DOB: 1951-02-26 Date of Telemedicine Visit: 07/03/2018  Referring provider: Lyndel Safe., MD Primary care provider: Lyndel Safe., MD  Chief Complaint: Asthma   Telemedicine Follow Up Visit via Telephone: I connected with Jennifer Pruitt for a follow up on 07/03/18 by telephone and verified that I am speaking with the correct person using two identifiers.   I discussed the limitations, risks, security and privacy concerns of performing an evaluation and management service by telephone and the availability of in person appointments. I also discussed with the patient that there may be a patient responsible charge related to this service. The patient expressed understanding and agreed to proceed.  Patient is at home. Provider is at the office.  Visit start time: 11:34 Visit end time: 10:01 Insurance consent/check in by: Rod Can Medical consent and medical assistant/nurse: Damits Smith  History of Present Illness: She is a 68 y.o. female, who is being followed for asthma, allergic rhinitis, reflux, food allergy with anaphylactic reaction to shellfish, and hypertension. Her previous allergy office visit was on 01/30/2018 with Dr. Beaulah Dinning. At today's visit, she reports her asthma has not been well controlled for the last 7 days with symptoms including shortness of breath and wheeze. She denies cough and fever. She is currently using Advair Diskus 500-50 one puff every 12 hours, Spiriva 1.25- 2 puffs once a day, montelukast 10 mg once a day, Xolair 150 mg injection once every 28 days, and over the last week she has been using her albuterol 2-3 times a day. She reports allergic rhinitis as well controlled with Nasonex and Zyrtec. She reports her eyes are red and itchy and she has recently started using Optivar with relief of symptoms. Her current medications are listed in the chart.    Assessment and Plan: [name] is a 68 y.o. female  with:  Asthma Prednisone 10 mg tablets. Take 2 tablets twice a day for 3 days, then take 2 tablets once a day for 1 day, then take 1 tablet on the 5th day, then stop Continue Advair Diskus 500-50 -1 puff every 12 hours to prevent coughing or wheezing- name brand only, she failed generic Ventolin 2 puffs every 4 hours if needed for wheezing or coughing or instead albuterol 0.083% 1 unit dose every 4 hours if needed Spiriva Respimat 1.25 mcg  -2 puffs every morning to prevent cough and wheeze Continue Xolair 150 mg once every 4 weeks  Allergic rhinitis Zyrtec 10 mg-take 1 tablet once a day if needed for runny nose Nasonex 1 spray per nostril twice a day if needed for stuffy nose Consider nasal saline rinses as needed for nasal symptoms Continue allergen avoidance measures  Allergic conjunctivitis Continue Optivar eye drops one drop in each eye twice a day as needed for red, itchy eyes.  Reflux Omeprazole 40 mg-take 1 capsule once a day for reflux Continue lifestyle modifications as listed below  Food allergy Continue to avoid shellfish.  If you have an allergic reaction give Benadryl 50 mg every 4 hours and if you has life-threatening symptoms inject with EpiPen 0.3 mg  Call us if you are not doing well on this treatment plan  Continue on your other medications as listed in your chart  Follow up in the clinic in 2 months or sooner if needed  Return in about 2 months (around 09/02/2018), or if symptoms worsen or fail to improve.  Meds ordered this encounter  Medications   predniSONE (DELTASONE) 10  MG tablet    Sig: 20 mg tablets twice a day for 3 days, then 20 mg on day 4, then 10 mg tablet on day 5    Dispense:  15 tablet    Refill:  0   cetirizine (ZYRTEC ALLERGY) 10 MG tablet    Sig: Take 1 tablet (10 mg total) by mouth at bedtime.    Dispense:  90 tablet    Refill:  3    Dispense 90 day supply   Lab Orders  No laboratory test(s) ordered today     Diagnostics: None.  Medication List:  Current Outpatient Medications  Medication Sig Dispense Refill   albuterol (PROAIR HFA) 108 (90 Base) MCG/ACT inhaler Inhale 2 puffs into the lungs every 4 (four) hours as needed for wheezing or shortness of breath. 3 Inhaler 1   albuterol (PROVENTIL) (2.5 MG/3ML) 0.083% nebulizer solution Take 3 mLs (2.5 mg total) by nebulization every 4 (four) hours as needed for wheezing or shortness of breath. 75 mL 3   azelastine (OPTIVAR) 0.05 % ophthalmic solution Place 1 drop into both eyes 2 (two) times daily as needed. 6 mL 5   Cholecalciferol (VITAMIN D-1000 MAX ST) 1000 units tablet Take by mouth.     Fe Fum-FA-B Cmp-C-Zn-Mg-Mn-Cu (HEMATINIC PLUS VIT/MINERALS) 106-1 MG TABS Take by mouth.     Fluticasone-Salmeterol (ADVAIR DISKUS) 500-50 MCG/DOSE AEPB One puff every 12 hours to prevent cough or wheeze. Rinse, gargle and spit after use. 3 each 2   glimepiride (AMARYL) 4 MG tablet 4 mg.     IRON PO Take by mouth.     levothyroxine (SYNTHROID, LEVOTHROID) 125 MCG tablet TAKE 1 TABLET BY MOUTH DAILY     lisinopril (PRINIVIL,ZESTRIL) 40 MG tablet 40 mg.     metFORMIN (GLUCOPHAGE) 500 MG tablet Take 500 mg by mouth.     mometasone (NASONEX) 50 MCG/ACT nasal spray Place 1 spray into the nose 2 (two) times daily as needed. 17 g 5   montelukast (SINGULAIR) 10 MG tablet Take 1 tablet (10 mg total) by mouth at bedtime. 90 tablet 3   omalizumab (XOLAIR) 150 MG injection Inject 150 mg into the skin every 28 (twenty-eight) days. 1 each 11   omeprazole (PRILOSEC) 40 MG capsule 40mg  once a day for acid refluz 30 capsule 5   Tiotropium Bromide Monohydrate (SPIRIVA RESPIMAT) 1.25 MCG/ACT AERS Inhale 2 puffs into the lungs every morning. 3 Inhaler 2   cetirizine (ZYRTEC ALLERGY) 10 MG tablet Take 1 tablet (10 mg total) by mouth at bedtime. 90 tablet 3   furosemide (LASIX) 40 MG tablet 40 mg.     Ipratropium-Albuterol (COMBIVENT RESPIMAT) 20-100 MCG/ACT  AERS respimat Inhale into the lungs.     predniSONE (DELTASONE) 10 MG tablet 20 mg tablets twice a day for 3 days, then 20 mg on day 4, then 10 mg tablet on day 5 15 tablet 0   Current Facility-Administered Medications  Medication Dose Route Frequency Provider Last Rate Last Dose   omalizumab Geoffry Paradise) injection 150 mg  150 mg Subcutaneous Q28 days Stephannie Li A, MD   150 mg at 06/20/18 1118   Allergies: Allergies  Allergen Reactions   Tapazole [Methimazole] Swelling and Rash    HIVES, caused lips to swell   Iodine Hives and Swelling   Shellfish Allergy    Wixela Inhub [Fluticasone-Salmeterol]     Failed trial of   I reviewed her past medical history, social history, family history, and environmental history and no significant changes  have been reported from previous visit on 01/30/2018.  Review of Systems  Constitutional: Negative for fever.  HENT: Positive for congestion.   Eyes: Positive for itching.  Respiratory: Positive for shortness of breath and wheezing.   Cardiovascular: Negative.   Gastrointestinal: Negative.   Musculoskeletal: Negative.   Neurological: Negative.   Psychiatric/Behavioral: Negative.    Objective: Physical Exam Not obtained as encounter was done via telephone.   Previous notes and tests were reviewed.  I discussed the assessment and treatment plan with the patient. The patient was provided an opportunity to ask questions and all were answered. The patient agreed with the plan and demonstrated an understanding of the instructions.   The patient was advised to call back or seek an in-person evaluation if the symptoms worsen or if the condition fails to improve as anticipated.  I provided 27 minutes of non-face-to-face time during this encounter.  It was my pleasure to participate in [place] care today. Please feel free to contact me with any questions or concerns.   Sincerely,  Thermon Leyland, FNP

## 2018-07-03 NOTE — Patient Instructions (Addendum)
Asthma Prednisone 10 mg tablets. Take 2 tablets twice a day for 3 days, then take 2 tablets once a day for 1 day, then take 1 tablet on the 5th day, then stop Continue Advair Diskus 500-50 -1 puff every 12 hours to prevent coughing or wheezing- name brand only, she failed generic Ventolin 2 puffs every 4 hours if needed for wheezing or coughing or instead albuterol 0.083% 1 unit dose every 4 hours if needed Spiriva Respimat 1.25 mcg  -2 puffs every morning to prevent cough and wheeze Continue Xolair 150 mg once every 4 weeks  Allergic rhinitis Zyrtec 10 mg-take 1 tablet once a day if needed for runny nose Nasonex 1 spray per nostril twice a day if needed for stuffy nose Consider nasal saline rinses as needed for nasal symptoms Continue allergen avoidance measures  Allergic conjunctivitis Continue Optivar eye drops one drop in each eye twice a day as needed for red, itchy eyes.  Reflux Omeprazole 40 mg-take 1 capsule once a day for reflux Continue lifestyle modifications as listed below  Food allergy Continue to avoid shellfish.  If you have an allergic reaction give Benadryl 50 mg every 4 hours and if you has life-threatening symptoms inject with EpiPen 0.3 mg  Call us if you are not doing well on this treatment plan  Continue on your other medications as listed in your chart  Follow up in the clinic in 2 months or sooner if needed   Lifestyle Changes for Controlling GERD When you have GERD, stomach acid feels as if it's backing up toward your mouth. Whether or not you take medication to control your GERD, your symptoms can often be improved with lifestyle changes.   Raise Your Head  Reflux is more likely to strike when you're lying down flat, because stomach fluid can  flow backward more easily. Raising the head of your bed 4-6 inches can help. To do this:  Slide blocks or books under the legs at the head of your bed. Or, place a wedge under  the mattress. Many foam stores  can make a suitable wedge for you. The wedge  should run from your waist to the top of your head.  Don't just prop your head on several pillows. This increases pressure on your  stomach. It can make GERD worse.  Watch Your Eating Habits Certain foods may increase the acid in your stomach or relax the lower esophageal sphincter, making GERD more likely. It's best to avoid the following:  Coffee, tea, and carbonated drinks (with and without caffeine)  Fatty, fried, or spicy food  Mint, chocolate, onions, and tomatoes  Any other foods that seem to irritate your stomach or cause you pain  Relieve the Pressure  Eat smaller meals, even if you have to eat more often.  Don't lie down right after you eat. Wait a few hours for your stomach to empty.  Avoid tight belts and tight-fitting clothes.  Lose excess weight.  Tobacco and Alcohol  Avoid smoking tobacco and drinking alcohol. They can make GERD symptoms worse.

## 2018-07-18 ENCOUNTER — Ambulatory Visit: Payer: Self-pay

## 2018-07-20 ENCOUNTER — Telehealth: Payer: Self-pay | Admitting: *Deleted

## 2018-07-20 NOTE — Telephone Encounter (Signed)
Only if she hasnt given permission to ship for the year.  They have to have it each month if they dont have permission to ship.

## 2018-07-20 NOTE — Telephone Encounter (Signed)
Pt calling regarding xolair- pt spoke with specialty pharmacy to give permission to ship and they told her they would call our office to verify delivery. We do not have her xolair in the office, her xolair apt was for 4/21. Pt wondering if this a new policy that pharmacy has to speak with [place] every month for delivery? Tammy please advise.

## 2018-07-21 NOTE — Telephone Encounter (Signed)
*  for the rest of the year*

## 2018-07-21 NOTE — Telephone Encounter (Signed)
Pt xolair arrived this morning- pt scheduled for xolair apt 4/28 next week.

## 2018-07-21 NOTE — Telephone Encounter (Signed)
She is still on track for delivery today if someone can reach out to he when it comes n to make her appt for injection

## 2018-07-21 NOTE — Telephone Encounter (Signed)
She stated that she gave pharm permission to ship.

## 2018-07-25 ENCOUNTER — Ambulatory Visit (INDEPENDENT_AMBULATORY_CARE_PROVIDER_SITE_OTHER): Payer: Medicare Other

## 2018-07-25 ENCOUNTER — Other Ambulatory Visit: Payer: Self-pay

## 2018-07-25 DIAGNOSIS — J4540 Moderate persistent asthma, uncomplicated: Secondary | ICD-10-CM

## 2018-07-25 DIAGNOSIS — J4551 Severe persistent asthma with (acute) exacerbation: Secondary | ICD-10-CM

## 2018-08-22 ENCOUNTER — Ambulatory Visit (INDEPENDENT_AMBULATORY_CARE_PROVIDER_SITE_OTHER): Payer: Medicare Other

## 2018-08-22 ENCOUNTER — Other Ambulatory Visit: Payer: Self-pay

## 2018-08-22 DIAGNOSIS — J4551 Severe persistent asthma with (acute) exacerbation: Secondary | ICD-10-CM

## 2018-08-22 DIAGNOSIS — J4550 Severe persistent asthma, uncomplicated: Secondary | ICD-10-CM | POA: Diagnosis not present

## 2018-09-19 ENCOUNTER — Ambulatory Visit: Payer: Self-pay

## 2018-09-20 ENCOUNTER — Ambulatory Visit: Payer: Medicare Other | Admitting: Family Medicine

## 2018-09-20 ENCOUNTER — Ambulatory Visit (INDEPENDENT_AMBULATORY_CARE_PROVIDER_SITE_OTHER): Payer: Medicare Other

## 2018-09-20 ENCOUNTER — Encounter: Payer: Self-pay | Admitting: Family Medicine

## 2018-09-20 ENCOUNTER — Other Ambulatory Visit: Payer: Self-pay

## 2018-09-20 VITALS — BP 126/58 | HR 91 | Temp 98.0°F | Resp 16

## 2018-09-20 DIAGNOSIS — J3089 Other allergic rhinitis: Secondary | ICD-10-CM | POA: Diagnosis not present

## 2018-09-20 DIAGNOSIS — J4550 Severe persistent asthma, uncomplicated: Secondary | ICD-10-CM

## 2018-09-20 DIAGNOSIS — H1010 Acute atopic conjunctivitis, unspecified eye: Secondary | ICD-10-CM

## 2018-09-20 DIAGNOSIS — K219 Gastro-esophageal reflux disease without esophagitis: Secondary | ICD-10-CM

## 2018-09-20 DIAGNOSIS — J4540 Moderate persistent asthma, uncomplicated: Secondary | ICD-10-CM

## 2018-09-20 DIAGNOSIS — T7800XD Anaphylactic reaction due to unspecified food, subsequent encounter: Secondary | ICD-10-CM

## 2018-09-20 DIAGNOSIS — I10 Essential (primary) hypertension: Secondary | ICD-10-CM

## 2018-09-20 MED ORDER — MONTELUKAST SODIUM 10 MG PO TABS: 10.0000 mg | ORAL_TABLET | Freq: Every day | ORAL | 3 refills | Status: DC

## 2018-09-20 MED ORDER — SPIRIVA RESPIMAT 1.25 MCG/ACT IN AERS: 2.0000 | INHALATION_SPRAY | RESPIRATORY_TRACT | 3 refills | Status: DC

## 2018-09-20 NOTE — Progress Notes (Addendum)
[address] [place]  [address] Dept: [phone]  FOLLOW UP NOTE  Patient ID: Jennifer Pruitt, female    DOB: 09-01-1950  Age: 68 y.o. MRN: [id number] Date of Office Visit: 09/20/2018  Assessment  Chief Complaint: Asthma  HPI Jennifer Pruitt is a 68 year old female who presents to the clinic for a follow up visit. She was last seen in this clinic on 07/03/2018 by Gareth Morgan, FNP for evaluation of asthma exacerbation, allergic rhinitis, allergic conjunctivitis, reflux, and food allergy. At that time, she required prednisone taper for resolution of symptoms. At today's visit, she reports that her breathing has been "feeling good" and her asthma symptoms have been "the same as usual". She reports shortness of breath and wheeze as "a little every day" which is aggravated by heat, rain, and humidity. She continues Advair 500-1 puff twice a day, Spiriva 1.25 mcg 2 puffs once a day, montelukast 10 mg once a day, and Xolair 150 mg once every 4 weeks. She reports that she uses her albuterol about 1-2 times a week. Allergic rhinitis is reported as well controlled with cetirizine, Nasonex, and nasal saline rinses as needed. She reports allergen immunotherapy is going well and has significantly decreased her symptoms of allergic rhinitis. Allergic conjunctivitis is well controlled with Optivar as needed. Reflux is reported as well controlled with no heartburn. She continues to avoid spicy foods and takes omeprazole 20 mg as needed. She continues to avoid shellfish. She has not had any accidental ingestion nor has she used her epinephrine device since her last visit to the clinic. Her current medications are listed in the chart.  Drug Allergies:  Allergies  Allergen Reactions  . Tapazole [Methimazole] Swelling and Rash    HIVES, caused lips to swell  . Iodine Hives and Swelling  . Shellfish Allergy   . Wixela Inhub [Fluticasone-Salmeterol]     Failed trial of    Physical Exam: BP (!)  126/58   Pulse 91   Temp 98 F (36.7 C) (Oral)   Resp 16   SpO2 97%    Physical Exam Vitals signs reviewed.  Constitutional:      Appearance: Normal appearance.  HENT:     Head: Normocephalic and atraumatic.     Right Ear: Tympanic membrane normal.     Left Ear: Tympanic membrane normal.     Nose:     Comments: Bilateral nares slightly erythematous with no nasal drainage noted. Pharynx normal. Ears normal. Eyes normal.    Mouth/Throat:     Pharynx: Oropharynx is clear.  Eyes:     Conjunctiva/sclera: Conjunctivae normal.  Neck:     Musculoskeletal: Normal range of motion and neck supple.  Cardiovascular:     Rate and Rhythm: Normal rate and regular rhythm.     Heart sounds: Normal heart sounds. No murmur.  Pulmonary:     Effort: Pulmonary effort is normal.     Breath sounds: Normal breath sounds.     Comments: Lungs clear to auscultation Musculoskeletal: Normal range of motion.  Skin:    General: Skin is warm and dry.  Neurological:     Mental Status: She is alert and oriented to person, place, and time.  Psychiatric:        Mood and Affect: Mood normal.        Behavior: Behavior normal.        Thought Content: Thought content normal.        Judgment: Judgment normal.  Diagnostics: FVC 1.25, FEV1 0.67. Predicted FVC 2.13, predicted FEV1 1.64. Spirometry indicates moderate restriction and moderate obstruction. Spirometry is consistent with previous readings.    Assessment and Plan: 1. Severe persistent asthma without complication   2. Other allergic rhinitis   3. Seasonal allergic conjunctivitis   4. Gastroesophageal reflux disease without esophagitis   5. Anaphylactic shock due to food, subsequent encounter   6. Essential hypertension     Meds ordered this encounter  Medications  . Tiotropium Bromide Monohydrate (SPIRIVA RESPIMAT) 1.25 MCG/ACT AERS    Sig: Inhale 2 puffs into the lungs every morning.    Dispense:  12 g    Refill:  3    Dispense 90 days  supply  . montelukast (SINGULAIR) 10 MG tablet    Sig: Take 1 tablet (10 mg total) by mouth at bedtime.    Dispense:  90 tablet    Refill:  3    Dispense 90 days supply.    Patient Instructions  Asthma Continue Advair Diskus 500-50 -1 puff every 12 hours to prevent coughing or wheezing- name brand only, she failed generic Continue montelukast 10 mg once a day to prevent cough or wheeze Spiriva Respimat 1.25 mcg  -2 puffs every morning to prevent cough and wheeze Ventolin 2 puffs every 4 hours if needed for wheezing or coughing or instead albuterol 0.083% 1 unit dose every 4 hours if needed Continue Xolair injections 150 mg once every 4 weeks and have access to an epinephrine device Should your allergy symptoms flare, despite compliance with your current medications, begin prednisone taper that was provided today, 2 tablets once a day for 4 days, and 1 tablet on the 5th day, then stop. Call the clinic if you begin this medication  Allergic rhinitis ContinueZyrtec 10 mg-take 1 tablet once a day if needed for runny nose Continue Nasonex 1 spray per nostril twice a day if needed for stuffy nose Consider nasal saline rinses as needed for nasal symptoms Continue allergen avoidance measures  Allergic conjunctivitis Continue Optivar eye drops one drop in each eye twice a day as needed for red, itchy eyes.  Reflux Omeprazole 40 mg-take 1 capsule once a day for reflux Continue lifestyle modifications as listed below  Food allergy Continue to avoid shellfish.  If you have an allergic reaction give Benadryl 50 mg every 4 hours and if you has life-threatening symptoms inject with EpiPen 0.3 mg  Call us if you are not doing well on this treatment plan  Continue on your other medications as listed in your chart  Follow up in the clinic in 2 months or sooner if needed   Return in about 2 months (around 11/20/2018), or if symptoms worsen or fail to improve.    Thank you for the opportunity  to care for this patient.  Please do not hesitate to contact me with questions.  Thermon LeylandAnne Jamil Armwood, FNP [facility]  _________________________________________________  I have provided oversight concerning [facility]'s evaluation and treatment of this patient's health issues addressed during today's encounter.  I agree with the assessment and therapeutic plan as outlined in the note.   Signed,   R Jorene Guestarter Bobbitt, MD

## 2018-09-20 NOTE — Patient Instructions (Addendum)
Asthma Continue Advair Diskus 500-50 -1 puff every 12 hours to prevent coughing or wheezing- name brand only, she failed generic Continue montelukast 10 mg once a day to prevent cough or wheeze Spiriva Respimat 1.25 mcg  -2 puffs every morning to prevent cough and wheeze Use Ventolin 2 puffs every 4 hours if needed for wheezing or coughing or instead albuterol 0.083% 1 unit dose every 4 hours if needed Continue Xolair injections 150 mg once every 4 weeks and have access to an epinephrine device Should your allergy symptoms flare, despite compliance with your current medications, begin prednisone taper that was provided today, 2 tablets once a day for 4 days, and 1 tablet on the 5th day, then stop. Call the clinic if you begin this medication  Allergic rhinitis Continue Zyrtec 10 mg-take 1 tablet once a day if needed for runny nose Continue Nasonex 1 spray per nostril twice a day if needed for stuffy nose Consider nasal saline rinses as needed for nasal symptoms Continue allergen avoidance measures  Allergic conjunctivitis Continue Optivar eye drops one drop in each eye twice a day as needed for red, itchy eyes.  Reflux Omeprazole 40 mg-take 1 capsule once a day for reflux Continue lifestyle modifications as listed below  Food allergy Continue to avoid shellfish.  If you have an allergic reaction give Benadryl 50 mg every 4 hours and if you has life-threatening symptoms inject with EpiPen 0.3 mg  Call us if you are not doing well on this treatment plan  Continue on your other medications as listed in your chart  Follow up in the clinic in 2 months or sooner if needed   Lifestyle Changes for Controlling GERD When you have GERD, stomach acid feels as if it's backing up toward your mouth. Whether or not you take medication to control your GERD, your symptoms can often be improved with lifestyle changes.   Raise Your Head  Reflux is more likely to strike when you're lying down flat,  because stomach fluid can  flow backward more easily. Raising the head of your bed 4-6 inches can help. To do this:  Slide blocks or books under the legs at the head of your bed. Or, place a wedge under  the mattress. Many foam stores can make a suitable wedge for you. The wedge  should run from your waist to the top of your head.  Don't just prop your head on several pillows. This increases pressure on your  stomach. It can make GERD worse.  Watch Your Eating Habits Certain foods may increase the acid in your stomach or relax the lower esophageal sphincter, making GERD more likely. It's best to avoid the following:  Coffee, tea, and carbonated drinks (with and without caffeine)  Fatty, fried, or spicy food  Mint, chocolate, onions, and tomatoes  Any other foods that seem to irritate your stomach or cause you pain  Relieve the Pressure  Eat smaller meals, even if you have to eat more often.  Don't lie down right after you eat. Wait a few hours for your stomach to empty.  Avoid tight belts and tight-fitting clothes.  Lose excess weight.  Tobacco and Alcohol  Avoid smoking tobacco and drinking alcohol. They can make GERD symptoms worse.

## 2018-10-02 ENCOUNTER — Other Ambulatory Visit: Payer: Self-pay | Admitting: Pediatrics

## 2018-10-17 ENCOUNTER — Ambulatory Visit: Payer: Self-pay

## 2018-11-06 ENCOUNTER — Other Ambulatory Visit: Payer: Self-pay

## 2018-11-06 ENCOUNTER — Ambulatory Visit (INDEPENDENT_AMBULATORY_CARE_PROVIDER_SITE_OTHER): Payer: Medicare Other

## 2018-11-06 DIAGNOSIS — J4550 Severe persistent asthma, uncomplicated: Secondary | ICD-10-CM

## 2018-12-05 ENCOUNTER — Other Ambulatory Visit: Payer: Self-pay

## 2018-12-05 ENCOUNTER — Ambulatory Visit (INDEPENDENT_AMBULATORY_CARE_PROVIDER_SITE_OTHER): Payer: Medicare Other

## 2018-12-05 DIAGNOSIS — J4550 Severe persistent asthma, uncomplicated: Secondary | ICD-10-CM | POA: Diagnosis not present

## 2019-01-01 ENCOUNTER — Other Ambulatory Visit: Payer: Self-pay

## 2019-01-01 ENCOUNTER — Ambulatory Visit (INDEPENDENT_AMBULATORY_CARE_PROVIDER_SITE_OTHER): Payer: Medicare Other

## 2019-01-01 DIAGNOSIS — J4550 Severe persistent asthma, uncomplicated: Secondary | ICD-10-CM | POA: Diagnosis not present

## 2019-01-29 ENCOUNTER — Ambulatory Visit (INDEPENDENT_AMBULATORY_CARE_PROVIDER_SITE_OTHER): Payer: Medicare Other

## 2019-01-29 ENCOUNTER — Other Ambulatory Visit: Payer: Self-pay

## 2019-01-29 DIAGNOSIS — J4550 Severe persistent asthma, uncomplicated: Secondary | ICD-10-CM | POA: Diagnosis not present

## 2019-01-30 ENCOUNTER — Ambulatory Visit: Payer: Self-pay

## 2019-02-21 ENCOUNTER — Encounter: Payer: Self-pay | Admitting: Family Medicine

## 2019-02-21 ENCOUNTER — Ambulatory Visit (INDEPENDENT_AMBULATORY_CARE_PROVIDER_SITE_OTHER): Payer: Medicare Other | Admitting: Family Medicine

## 2019-02-21 DIAGNOSIS — K219 Gastro-esophageal reflux disease without esophagitis: Secondary | ICD-10-CM

## 2019-02-21 DIAGNOSIS — H1010 Acute atopic conjunctivitis, unspecified eye: Secondary | ICD-10-CM | POA: Diagnosis not present

## 2019-02-21 DIAGNOSIS — J4550 Severe persistent asthma, uncomplicated: Secondary | ICD-10-CM | POA: Diagnosis not present

## 2019-02-21 DIAGNOSIS — T7800XD Anaphylactic reaction due to unspecified food, subsequent encounter: Secondary | ICD-10-CM

## 2019-02-21 DIAGNOSIS — J3089 Other allergic rhinitis: Secondary | ICD-10-CM | POA: Diagnosis not present

## 2019-02-21 MED ORDER — ALBUTEROL SULFATE (2.5 MG/3ML) 0.083% IN NEBU: 2.5000 mg | INHALATION_SOLUTION | RESPIRATORY_TRACT | 3 refills | Status: DC | PRN

## 2019-02-21 MED ORDER — PREDNISONE 10 MG PO TABS: ORAL_TABLET | ORAL | 0 refills | Status: DC

## 2019-02-21 MED ORDER — AZELASTINE HCL 0.05 % OP SOLN: 1.0000 [drp] | Freq: Two times a day (BID) | OPHTHALMIC | 3 refills | Status: DC | PRN

## 2019-02-21 MED ORDER — ALBUTEROL SULFATE HFA 108 (90 BASE) MCG/ACT IN AERS: 2.0000 | INHALATION_SPRAY | RESPIRATORY_TRACT | 1 refills | Status: DC | PRN

## 2019-02-21 MED ORDER — CETIRIZINE HCL 10 MG PO TABS: 10.0000 mg | ORAL_TABLET | Freq: Every day | ORAL | 3 refills | Status: DC

## 2019-02-21 MED ORDER — MONTELUKAST SODIUM 10 MG PO TABS: 10.0000 mg | ORAL_TABLET | Freq: Every day | ORAL | 3 refills | Status: DC

## 2019-02-21 MED ORDER — FLUTICASONE-SALMETEROL 500-50 MCG/DOSE IN AEPB: INHALATION_SPRAY | RESPIRATORY_TRACT | 3 refills | Status: DC

## 2019-02-21 MED ORDER — SPIRIVA RESPIMAT 1.25 MCG/ACT IN AERS: 2.0000 | INHALATION_SPRAY | RESPIRATORY_TRACT | 3 refills | Status: DC

## 2019-02-21 MED ORDER — OMEPRAZOLE 40 MG PO CPDR: DELAYED_RELEASE_CAPSULE | ORAL | 3 refills | Status: DC

## 2019-02-21 MED ORDER — MOMETASONE FUROATE 50 MCG/ACT NA SUSP: 1.0000 | Freq: Two times a day (BID) | NASAL | 3 refills | Status: DC | PRN

## 2019-02-21 NOTE — Patient Instructions (Addendum)
Asthma Continue Advair Diskus 500-50 -1 puff every 12 hours to prevent coughing or wheezing- name brand only, she failed generic Continue montelukast 10 mg once a day to prevent cough or wheeze Spiriva Respimat 1.25 mcg  -2 puffs every morning to prevent cough and wheeze Use Ventolin 2 puffs every 4 hours if needed for wheezing or coughing or instead albuterol 0.083% 1 unit dose every 4 hours if needed Continue Xolair injections 150 mg once every 4 weeks and have access to an epinephrine device Should your allergy symptoms flare, despite compliance with your current medications, begin prednisone taper that was provided today, 2 tablets once a day for 4 days, and 1 tablet on the 5th day, then stop. Call the clinic if you begin this medication  Allergic rhinitis Continue Zyrtec 10 mg-take 1 tablet once a day if needed for runny nose Continue Nasonex 1 spray per nostril twice a day if needed for stuffy nose Consider nasal saline rinses as needed for nasal symptoms Continue allergen avoidance measures  Allergic conjunctivitis Continue Optivar eye drops one drop in each eye twice a day as needed for red, itchy eyes.  Reflux Omeprazole 40 mg-take 1 capsule once a day for reflux Continue lifestyle modifications as listed below  Food allergy Continue to avoid shellfish.  If you have an allergic reaction give Benadryl 50 mg every 4 hours and if you has life-threatening symptoms inject with EpiPen 0.3 mg  Call us if you are not doing well on this treatment plan  Continue on your other medications as listed in your chart  Follow up in the clinic in 4 months or sooner if needed   Lifestyle Changes for Controlling GERD When you have GERD, stomach acid feels as if it's backing up toward your mouth. Whether or not you take medication to control your GERD, your symptoms can often be improved with lifestyle changes.   Raise Your Head  Reflux is more likely to strike when you're lying down flat,  because stomach fluid can  flow backward more easily. Raising the head of your bed 4-6 inches can help. To do this:  Slide blocks or books under the legs at the head of your bed. Or, place a wedge under  the mattress. Many foam stores can make a suitable wedge for you. The wedge  should run from your waist to the top of your head.  Don't just prop your head on several pillows. This increases pressure on your  stomach. It can make GERD worse.  Watch Your Eating Habits Certain foods may increase the acid in your stomach or relax the lower esophageal sphincter, making GERD more likely. It's best to avoid the following:  Coffee, tea, and carbonated drinks (with and without caffeine)  Fatty, fried, or spicy food  Mint, chocolate, onions, and tomatoes  Any other foods that seem to irritate your stomach or cause you pain  Relieve the Pressure  Eat smaller meals, even if you have to eat more often.  Don't lie down right after you eat. Wait a few hours for your stomach to empty.  Avoid tight belts and tight-fitting clothes.  Lose excess weight.  Tobacco and Alcohol  Avoid smoking tobacco and drinking alcohol. They can make GERD symptoms worse.

## 2019-02-21 NOTE — Addendum Note (Signed)
Addended by: Katherina Right D on: 02/21/2019 02:16 PM   Modules accepted: Orders

## 2019-02-21 NOTE — Progress Notes (Addendum)
RE: Jennifer Pruitt MRN: [id number] DOB: 08/02/1950 Date of Telemedicine Visit: 02/21/2019  Referring provider: Lyndel Safe., MD Primary care provider: Lyndel Safe., MD  Chief Complaint: Asthma   Telemedicine Follow Up Visit via Telephone: I connected with Jennifer Pruitt for a follow up on 02/21/19 by telephone and verified that I am speaking with the correct person using two identifiers.   I discussed the limitations, risks, security and privacy concerns of performing an evaluation and management service by telephone and the availability of in person appointments. I also discussed with the patient that there may be a patient responsible charge related to this service. The patient expressed understanding and agreed to proceed.  Patient is at home  Provider is at the office.  Visit start time: 10:33 Visit end time: 10:52 Insurance consent/check in by: Nicholaus Corolla Medical consent and medical assistant/nurse: Maryjean Morn  History of Present Illness: She is a 68 y.o. female, who is being followed for asthma, allergic rhinitis, allergic conjunctivitis, reflux, and food allergies to shellfish. Her previous allergy office visit was on 09/20/2018 with Thermon Leyland, FNP. At today's visit, she reports her asthma has been well controled with "the usual amount" of shortness of breath and wheeze which is worse with activity. She denies cough with activity and rest.  She continues Advair 500-50 with 1 puff every 12 hours, montelukast 10 mg once a day and rarely needs to use her albuterol inhaler. She continues Xolair 150 mg injections once every 4 weeks. Allergic rhinitis is reported as well controlled with nasal congestion occurring mainly in the morning for which she has recently started using cetirizine and Nasonex with relief of symptoms. Allergic conjunctivitis is reported as not well controlled with clear matted material in both eyes that occurs on some mornings. She denies itching. She is  using Optivar eye drops as needed. Reflux is reported as well controlled with omeprazole 40 mg once a day in addition to avoiding foods such as pizza and beans. She continues to avoid shellfish and has not had any accidental ingestion nor has she needed to use her epinephrine device since her last visit to this clinic. Her current medications are listed in the chart.   Assessment and Plan: Jennifer Pruitt is a 68 y.o. female with: Patient Instructions  Asthma Continue Advair Diskus 500-50 -1 puff every 12 hours to prevent coughing or wheezing- name brand only, she failed generic Continue montelukast 10 mg once a day to prevent cough or wheeze Spiriva Respimat 1.25 mcg  -2 puffs every morning to prevent cough and wheeze Use Ventolin 2 puffs every 4 hours if needed for wheezing or coughing or instead albuterol 0.083% 1 unit dose every 4 hours if needed Continue Xolair injections 150 mg once every 4 weeks and have access to an epinephrine device Should your allergy symptoms flare, despite compliance with your current medications, begin prednisone taper that was provided today, 2 tablets once a day for 4 days, and 1 tablet on the 5th day, then stop. Call the clinic if you begin this medication  Allergic rhinitis Continue Zyrtec 10 mg-take 1 tablet once a day if needed for runny nose Continue Nasonex 1 spray per nostril twice a day if needed for stuffy nose Consider nasal saline rinses as needed for nasal symptoms Continue allergen avoidance measures  Allergic conjunctivitis Continue Optivar eye drops one drop in each eye twice a day as needed for red, itchy eyes.  Reflux Omeprazole 40 mg-take 1 capsule once a day  for reflux Continue lifestyle modifications as listed below  Food allergy Continue to avoid shellfish.  If you have an allergic reaction give Benadryl 50 mg every 4 hours and if you has life-threatening symptoms inject with EpiPen 0.3 mg  Call us if you are not doing well on this treatment  plan  Continue on your other medications as listed in your chart  Follow up in the clinic in 4 months or sooner if needed    Return in about 4 months (around 06/21/2019), or if symptoms worsen or fail to improve.  Meds ordered this encounter  Medications  . Tiotropium Bromide Monohydrate (SPIRIVA RESPIMAT) 1.25 MCG/ACT AERS    Sig: Inhale 2 puffs into the lungs every morning.    Dispense:  12 g    Refill:  3    Dispense 90 days supply  . montelukast (SINGULAIR) 10 MG tablet    Sig: Take 1 tablet (10 mg total) by mouth at bedtime.    Dispense:  90 tablet    Refill:  3    Dispense 90 days supply.  . mometasone (NASONEX) 50 MCG/ACT nasal spray    Sig: Place 1 spray into the nose 2 (two) times daily as needed.    Dispense:  51 g    Refill:  3    90 day supply  . Fluticasone-Salmeterol (ADVAIR DISKUS) 500-50 MCG/DOSE AEPB    Sig: One puff every 12 hours to prevent cough or wheeze. Rinse, gargle and spit after use.    Dispense:  3 each    Refill:  3    Dispense 90 days supply BRAND ONLY  . cetirizine (ZYRTEC ALLERGY) 10 MG tablet    Sig: Take 1 tablet (10 mg total) by mouth at bedtime.    Dispense:  90 tablet    Refill:  3    Dispense 90 day supply  . azelastine (OPTIVAR) 0.05 % ophthalmic solution    Sig: Place 1 drop into both eyes 2 (two) times daily as needed.    Dispense:  18 mL    Refill:  3    90 day supply  . albuterol (PROAIR HFA) 108 (90 Base) MCG/ACT inhaler    Sig: Inhale 2 puffs into the lungs every 4 (four) hours as needed for wheezing or shortness of breath.    Dispense:  24 g    Refill:  1    Dispense 90 days supply  . albuterol (PROVENTIL) (2.5 MG/3ML) 0.083% nebulizer solution    Sig: Take 3 mLs (2.5 mg total) by nebulization every 4 (four) hours as needed for wheezing or shortness of breath.    Dispense:  75 mL    Refill:  3  . omeprazole (PRILOSEC) 40 MG capsule    Sig: 40mg  once a day for acid refluz    Dispense:  90 capsule    Refill:  3    90 day  supply  . predniSONE (DELTASONE) 10 MG tablet    Sig: As needed if asthma symptoms not under control take 2 tablets once a day for 4 days, then take 1 tablet on the 5th day, then stop    Dispense:  9 tablet    Refill:  0    Medication List:  Current Outpatient Medications  Medication Sig Dispense Refill  . albuterol (PROAIR HFA) 108 (90 Base) MCG/ACT inhaler Inhale 2 puffs into the lungs every 4 (four) hours as needed for wheezing or shortness of breath. 24 g 1  . albuterol (PROVENTIL) (  2.5 MG/3ML) 0.083% nebulizer solution Take 3 mLs (2.5 mg total) by nebulization every 4 (four) hours as needed for wheezing or shortness of breath. 75 mL 3  . azelastine (OPTIVAR) 0.05 % ophthalmic solution Place 1 drop into both eyes 2 (two) times daily as needed. 18 mL 3  . cetirizine (ZYRTEC ALLERGY) 10 MG tablet Take 1 tablet (10 mg total) by mouth at bedtime. 90 tablet 3  . Cholecalciferol (VITAMIN D-1000 MAX ST) 1000 units tablet Take by mouth.    . Fe Fum-FA-B Cmp-C-Zn-Mg-Mn-Cu (HEMATINIC PLUS VIT/MINERALS) 106-1 MG TABS Take by mouth.    . Fluticasone-Salmeterol (ADVAIR DISKUS) 500-50 MCG/DOSE AEPB One puff every 12 hours to prevent cough or wheeze. Rinse, gargle and spit after use. 3 each 3  . furosemide (LASIX) 40 MG tablet 40 mg.    . glimepiride (AMARYL) 4 MG tablet 4 mg.    . IRON PO Take by mouth.    . levothyroxine (SYNTHROID, LEVOTHROID) 125 MCG tablet TAKE 1 TABLET BY MOUTH DAILY    . lisinopril (PRINIVIL,ZESTRIL) 40 MG tablet 40 mg.    . metFORMIN (GLUCOPHAGE) 500 MG tablet Take 500 mg by mouth.    . mometasone (NASONEX) 50 MCG/ACT nasal spray Place 1 spray into the nose 2 (two) times daily as needed. 51 g 3  . montelukast (SINGULAIR) 10 MG tablet Take 1 tablet (10 mg total) by mouth at bedtime. 90 tablet 3  . omeprazole (PRILOSEC) 40 MG capsule 40mg  once a day for acid refluz 90 capsule 3  . Tiotropium Bromide Monohydrate (SPIRIVA RESPIMAT) 1.25 MCG/ACT AERS Inhale 2 puffs into the lungs  every morning. 12 g 3  . XOLAIR 150 MG injection INJECT 150MG  SUBCUTANEOUSLY EVERY 4 WEEKS (GIVEN AT MD  OFFICE) 1 each 11  . predniSONE (DELTASONE) 10 MG tablet As needed if asthma symptoms not under control take 2 tablets once a day for 4 days, then take 1 tablet on the 5th day, then stop 9 tablet 0   Current Facility-Administered Medications  Medication Dose Route Frequency Provider Last Rate Last Dose  . omalizumab Arvid Right) injection 150 mg  150 mg Subcutaneous Q28 days Prince Solian A, MD   150 mg at 01/29/19 1118   Allergies: Allergies  Allergen Reactions  . Tapazole [Methimazole] Swelling and Rash    HIVES, caused lips to swell  . Iodine Hives and Swelling  . Shellfish Allergy   . Wixela Inhub [Fluticasone-Salmeterol]     Failed trial of   I reviewed her past medical history, social history, family history, and environmental history and no significant changes have been reported from previous visit on 09/20/2018.  Objective: Physical Exam Not obtained as encounter was done via telephone.   Previous notes and tests were reviewed.  I discussed the assessment and treatment plan with the patient. The patient was provided an opportunity to ask questions and all were answered. The patient agreed with the plan and demonstrated an understanding of the instructions.   The patient was advised to call back or seek an in-person evaluation if the symptoms worsen or if the condition fails to improve as anticipated.  I provided 19 minutes of non-face-to-face time during this encounter.  It was my pleasure to participate in [place] care today. Please feel free to contact me with any questions or concerns.   Sincerely,  Gareth Morgan, FNP  ________________________________________________  I have provided oversight concerning [facility]'s evaluation and treatment of this patient's health issues addressed during today's encounter.  I agree with the assessment and therapeutic plan as  outlined in the note.   Signed,   R Jorene Guest, MD

## 2019-02-21 NOTE — Addendum Note (Signed)
Addended by: Katherina Right D on: 02/21/2019 03:04 PM   Modules accepted: Orders

## 2019-02-21 NOTE — Addendum Note (Signed)
Addended by: Katherina Right D on: 02/21/2019 03:13 PM   Modules accepted: Orders

## 2019-02-27 ENCOUNTER — Ambulatory Visit (INDEPENDENT_AMBULATORY_CARE_PROVIDER_SITE_OTHER): Payer: Medicare Other

## 2019-02-27 ENCOUNTER — Other Ambulatory Visit: Payer: Self-pay

## 2019-02-27 DIAGNOSIS — J4540 Moderate persistent asthma, uncomplicated: Secondary | ICD-10-CM

## 2019-03-12 ENCOUNTER — Telehealth: Payer: Self-pay

## 2019-03-12 NOTE — Telephone Encounter (Signed)
Pt called and wanted to ask what you think about her getting the covid vaccine. She has food allergies and environmental allergies and dr fuccine said if you have allergies to not get the vaccine. She wanted your professional opinion.

## 2019-03-13 NOTE — Telephone Encounter (Signed)
My recommendation is to wait until the Moderna vaccine is evaluated .  The way the Moderna vaccine is made should not result in allergic reactions but we need to see the results of the trials.  We will know soon

## 2019-03-13 NOTE — Telephone Encounter (Signed)
Left message for patient to call us back.  

## 2019-03-15 NOTE — Telephone Encounter (Signed)
PT called back in regards to getting covid vaccine. Informed pt of Dr Shaune Leeks recommendations.

## 2019-03-27 ENCOUNTER — Ambulatory Visit (INDEPENDENT_AMBULATORY_CARE_PROVIDER_SITE_OTHER): Payer: Medicare Other

## 2019-03-27 ENCOUNTER — Other Ambulatory Visit: Payer: Self-pay

## 2019-03-27 DIAGNOSIS — J4540 Moderate persistent asthma, uncomplicated: Secondary | ICD-10-CM

## 2019-04-17 ENCOUNTER — Other Ambulatory Visit: Payer: Self-pay

## 2019-04-17 ENCOUNTER — Encounter: Payer: Self-pay | Admitting: Pediatrics

## 2019-04-17 ENCOUNTER — Ambulatory Visit (INDEPENDENT_AMBULATORY_CARE_PROVIDER_SITE_OTHER): Payer: Medicare Other | Admitting: Pediatrics

## 2019-04-17 VITALS — BP 162/78 | HR 102 | Temp 96.5°F | Resp 20

## 2019-04-17 DIAGNOSIS — I10 Essential (primary) hypertension: Secondary | ICD-10-CM | POA: Diagnosis not present

## 2019-04-17 DIAGNOSIS — T7800XD Anaphylactic reaction due to unspecified food, subsequent encounter: Secondary | ICD-10-CM

## 2019-04-17 DIAGNOSIS — L508 Other urticaria: Secondary | ICD-10-CM | POA: Diagnosis not present

## 2019-04-17 DIAGNOSIS — J4550 Severe persistent asthma, uncomplicated: Secondary | ICD-10-CM

## 2019-04-17 DIAGNOSIS — E1169 Type 2 diabetes mellitus with other specified complication: Secondary | ICD-10-CM

## 2019-04-17 MED ORDER — SPIRIVA RESPIMAT 1.25 MCG/ACT IN AERS: 2.0000 | INHALATION_SPRAY | RESPIRATORY_TRACT | 3 refills | Status: DC

## 2019-04-17 MED ORDER — MONTELUKAST SODIUM 10 MG PO TABS: 10.0000 mg | ORAL_TABLET | Freq: Every day | ORAL | 3 refills | Status: DC

## 2019-04-17 MED ORDER — FAMOTIDINE 20 MG PO TABS: ORAL_TABLET | ORAL | 5 refills | Status: DC

## 2019-04-17 MED ORDER — HYDROXYZINE HCL 25 MG PO TABS: ORAL_TABLET | ORAL | 3 refills | Status: DC

## 2019-04-17 NOTE — Patient Instructions (Signed)
Allegra 180 mg in the morning and hydroxyzine 25 mg in the afternoon and at night Famotidine 20 mg-take 1 tablet twice a day-it may help hives and reflux.  You may stop once the hives go away Prednisone 10 mg twice a day for 4 days, 10 mg on the fifth day  Advair 500/50-1 puff every 12 hours to prevent coughing or wheezing Montelukast 10 mg once a day to prevent coughing or wheezing Spiriva Respimat 1.25 MCG-2 puffs every morning to help her breathing Ventolin 2 puffs every 4 hours if needed for wheezing or coughing spells or instead albuterol 0.083% 1 unit dose every 4 hours if needed Xolair injections 150 mg every 28 days  Avoid fish and shellfish.  If you have an allergic reaction take Benadryl 50 mg every 4 hours and if you have life-threatening symptoms inject with EpiPen 0.3 mg  Continue on your other medications Call us if you are not doing well on this treatment plan

## 2019-04-17 NOTE — Progress Notes (Signed)
[address] [place] [address] Dept: [phone]  FOLLOW UP NOTE  Patient ID: Jennifer Pruitt, female    DOB: 1950-05-16  Age: 69 y.o. MRN: [id number] Date of Office Visit: 04/17/2019  Assessment  Chief Complaint: Urticaria  HPI Jennifer Pruitt presents for evaluation of urticaria for 4 days despite the use of Allegra 180 mg once a day and Benadryl as needed.  She has a history of urticaria in the past from strep throat.  She has not had a fever.  She has not been taking any new medications and she has not been eating any new foods.  She continues to avoid shellfish her acid reflux is controlled by omeprazole 40 mg once a day Despite the hives her asthma has been well controlled with the use of Advair 500/50-1 puff every 12 hours, montelukast 10 mg once a day and Spiriva Respimat 1.25 MCG's-2 puffs once a day and Xolair 150 mg every 28 days  Other current medications are outlined in the chart   Drug Allergies:  Allergies  Allergen Reactions  . Tapazole [Methimazole] Swelling and Rash    HIVES, caused lips to swell  . Iodine Hives and Swelling  . Shellfish Allergy   . Wixela Inhub [Fluticasone-Salmeterol]     Failed trial of    Physical Exam: BP (!) 162/78   Pulse (!) 102   Temp (!) 96.5 F (35.8 C) (Temporal)   Resp 20   SpO2 98%    Physical Exam Vitals reviewed.  Constitutional:      Appearance: Normal appearance. She is obese.  HENT:     Head:     Comments: Eyes normal.  Ears normal.  Nose normal.  Pharynx normal. Cardiovascular:     Comments: S1-S2 normal no murmurs Pulmonary:     Comments: Clear to percussion and auscultation except for some decreased breath sounds at the bases Musculoskeletal:     Cervical back: Neck supple.  Lymphadenopathy:     Cervical: No cervical adenopathy.  Skin:    Comments: About  6 small hives noted  Neurological:     General: No focal deficit present.     Mental Status: She is alert and oriented to person, place,  and time. Mental status is at baseline.  Psychiatric:        Mood and Affect: Mood normal.        Behavior: Behavior normal.        Thought Content: Thought content normal.        Judgment: Judgment normal.     Diagnostics: FVC 1.22 L FEV1 0.68 L.  Predicted FVC 2.13 L predicted FEV1 1.64 L-this shows a moderate reduction in the forced vital capacity and FEV1  Assessment and Plan: 1. Severe persistent asthma without complication   2. Anaphylactic shock due to food, subsequent encounter   3. Essential hypertension   4. Acute urticaria   5. Type 2 diabetes mellitus with other specified complication, without long-term current use of insulin (HCC)     Meds ordered this encounter  Medications  . DISCONTD: montelukast (SINGULAIR) 10 MG tablet    Sig: Take 1 tablet (10 mg total) by mouth at bedtime.    Dispense:  90 tablet    Refill:  3    Dispense 90 days supply.  Marland Kitchen DISCONTD: Tiotropium Bromide Monohydrate (SPIRIVA RESPIMAT) 1.25 MCG/ACT AERS    Sig: Inhale 2 puffs into the lungs every morning.    Dispense:  12 g    Refill:  3    Dispense 90 days supply  . hydrOXYzine (ATARAX/VISTARIL) 25 MG tablet    Sig: One tablet in the afternoon and one tablet at night as needed for itching.    Dispense:  60 tablet    Refill:  3  . famotidine (PEPCID) 20 MG tablet    Sig: One tablet twice a day.    Dispense:  60 tablet    Refill:  5    Patient Instructions  Allegra 180 mg in the morning and hydroxyzine 25 mg in the afternoon and at night Famotidine 20 mg-take 1 tablet twice a day-it may help hives and reflux.  You may stop once the hives go away Prednisone 10 mg twice a day for 4 days, 10 mg on the fifth day  Advair 500/50-1 puff every 12 hours to prevent coughing or wheezing Montelukast 10 mg once a day to prevent coughing or wheezing Spiriva Respimat 1.25 MCG-2 puffs every morning to help her breathing Ventolin 2 puffs every 4 hours if needed for wheezing or coughing spells or  instead albuterol 0.083% 1 unit dose every 4 hours if needed Xolair injections 150 mg every 28 days  Avoid fish and shellfish.  If you have an allergic reaction take Benadryl 50 mg every 4 hours and if you have life-threatening symptoms inject with EpiPen 0.3 mg  Continue on your other medications Call us if you are not doing well on this treatment plan   Return in about 6 months (around 10/15/2019).    Thank you for the opportunity to care for this patient.  Please do not hesitate to contact me with questions.  Penne Lash, M.D.  [facility] [address] [place], [place] [address] [phone]

## 2019-04-18 ENCOUNTER — Telehealth: Payer: Self-pay

## 2019-04-18 NOTE — Telephone Encounter (Signed)
I want her on the Spiriva Respimat 1.25 MCG's-2 puffs every 24 hours to help her breathing

## 2019-04-18 NOTE — Telephone Encounter (Signed)
pts pharmacy wants to know would you rather the patient be on the spiriva or the combivent respimat? If not both then which do you prefer for her!

## 2019-04-19 ENCOUNTER — Other Ambulatory Visit: Payer: Self-pay

## 2019-04-19 MED ORDER — SPIRIVA RESPIMAT 1.25 MCG/ACT IN AERS: 2.0000 | INHALATION_SPRAY | RESPIRATORY_TRACT | 5 refills | Status: DC

## 2019-04-19 NOTE — Telephone Encounter (Signed)
Sent in right med and will inform pt

## 2019-04-24 ENCOUNTER — Ambulatory Visit (INDEPENDENT_AMBULATORY_CARE_PROVIDER_SITE_OTHER): Payer: Medicare Other

## 2019-04-24 ENCOUNTER — Other Ambulatory Visit: Payer: Self-pay

## 2019-04-24 DIAGNOSIS — J4540 Moderate persistent asthma, uncomplicated: Secondary | ICD-10-CM | POA: Diagnosis not present

## 2019-05-22 ENCOUNTER — Other Ambulatory Visit: Payer: Self-pay

## 2019-05-22 ENCOUNTER — Ambulatory Visit (INDEPENDENT_AMBULATORY_CARE_PROVIDER_SITE_OTHER): Payer: Medicare Other

## 2019-05-22 DIAGNOSIS — J4540 Moderate persistent asthma, uncomplicated: Secondary | ICD-10-CM

## 2019-05-22 MED ORDER — EPINEPHRINE 0.3 MG/0.3ML IJ SOAJ: INTRAMUSCULAR | 3 refills | Status: DC

## 2019-06-19 ENCOUNTER — Ambulatory Visit (INDEPENDENT_AMBULATORY_CARE_PROVIDER_SITE_OTHER): Payer: Medicare Other | Admitting: *Deleted

## 2019-06-19 ENCOUNTER — Other Ambulatory Visit: Payer: Self-pay

## 2019-06-19 DIAGNOSIS — J4540 Moderate persistent asthma, uncomplicated: Secondary | ICD-10-CM

## 2019-07-09 DIAGNOSIS — J301 Allergic rhinitis due to pollen: Secondary | ICD-10-CM

## 2019-07-17 ENCOUNTER — Other Ambulatory Visit: Payer: Self-pay

## 2019-07-17 ENCOUNTER — Ambulatory Visit (INDEPENDENT_AMBULATORY_CARE_PROVIDER_SITE_OTHER): Payer: Medicare Other | Admitting: *Deleted

## 2019-07-17 DIAGNOSIS — J4540 Moderate persistent asthma, uncomplicated: Secondary | ICD-10-CM

## 2019-08-14 ENCOUNTER — Ambulatory Visit (INDEPENDENT_AMBULATORY_CARE_PROVIDER_SITE_OTHER): Payer: Medicare Other

## 2019-08-14 ENCOUNTER — Other Ambulatory Visit: Payer: Self-pay

## 2019-08-14 DIAGNOSIS — J4540 Moderate persistent asthma, uncomplicated: Secondary | ICD-10-CM

## 2019-08-21 NOTE — Progress Notes (Addendum)
[address] [place] [address] Dept: [phone]  FOLLOW UP NOTE  Patient ID: Jennifer Pruitt, female    DOB: 31-Jul-1950  Age: 69 y.o. MRN: [id number] Date of Office Visit: 08/22/2019  Assessment  Chief Complaint: Asthma, Allergic Rhinitis , and Urticaria (improved today)  HPI Jennifer Pruitt is a 69 year old female that presents for follow-up of severe persistent asthma, allergic rhinitis, allergic conjunctivitis food allergy and acute urticaria.  She was last seen on April 17, 2019 by Dr. Shaune Leeks.  She reports wheezing every day, shortness of breath with rest and exertion, but worse with exertion and denies coughing, tightness in her chest or nocturnal symptoms.  The patient reports that the symptoms occur every day and that there have been no changes in her breathing since her last office visit.  Today in the office she refused bronchodilator therapy and post spirometry despite instructing her that her FVC and FEV1 were down from previous spirometry.  She is currently using Advair 500/50, 1 puff twice a day, montelukast 10 mg once a day and Spiriva Respimat 1.25 mcg 1 puff twice a day.She is currently using her albuterol once a day.    She continues to receive Xolair injections 150 mg every 28 days.  She request a prednisone pack to have on hand.  Urticaria is reported as well controlled.  She reports that the last time she had hives was on June 07, 2019 when she went to the emergency room.  On this day she reports that her hives were worse and had facial swelling on the left side of the jaw and tongue.  She also reports that she had shortness of breath, wheezing, tightness in chest, and her throat hurting she denied any difficulty swallowing, nausea or vomiting and chest pain.  The emergency room record shows that she was given IV Solu-Medrol, IV Benadryl, IV Pepcid and epinephrine IM.  She reports that she had not tried any new foods, medications or products.  She currently  avoids shellfish and Tapazole as she knows that these will cause her problems.  Due to Xolair use lab work to foods was offered to foods she suspected could be the cause of the symptoms. She does not wish for any further testing at this point in time.Instructed her that she could call the office at any time if she changes her mind.  Allergic conjunctivitis is reported as moderately controlled.  She she reports occasional itchy watery eyes for which her Optivar helps.  She reports rhinorrhea and stuffy nose that occurs mostly in the morning.  She continues to take cetirizine 10 mg once a day and Nasonex 1 to 2 sprays each nostril once a day as needed. Spoke with the patient on the phone after her visit and warned her that 2nd generation antihistamines such as Zyrtec and Allegra can cause sedation.  Cautioned her with driving and taking Zyrtec or Allegra. She verbalizes understanding. She reports that she has not felt any different while on Zyrtec or Allegra and would actually like Allegra called in.  Reflux is reported as moderately controlled with omeprazole 40 mg once a day.  She reports occasional burning sensation and a sensation of food coming up depending on if she watches her diet.  She continues to avoid shellfish and has not had any accidental ingestion since her last office visit. Medications are as listed in chart.  Drug Allergies:  Allergies  Allergen Reactions  . Tapazole [Methimazole] Swelling and Rash  HIVES, caused lips to swell  . Iodine Hives and Swelling  . Shellfish Allergy   . Wixela Inhub [Fluticasone-Salmeterol]     Failed trial of    Physical Exam: BP (!) 124/58 (BP Location: Right Arm, Patient Position: Sitting, Cuff Size: Large)   Pulse 86   Temp 98.9 F (37.2 C) (Oral)   Resp 18   Ht 5' 1.4" (1.56 m)   Wt 205 lb 0.4 oz (93 kg)   SpO2 97%   BMI 38.24 kg/m    Physical Exam Constitutional:      Appearance: Normal appearance.  HENT:     Head:  Normocephalic and atraumatic.     Comments: Pharynx normal. Eyes normal. Nose normal. Ears normal.    Right Ear: Tympanic membrane, ear canal and external ear normal.     Left Ear: Tympanic membrane, ear canal and external ear normal.     Nose: Nose normal.     Mouth/Throat:     Mouth: Mucous membranes are moist.     Pharynx: Oropharynx is clear.  Eyes:     Conjunctiva/sclera: Conjunctivae normal.  Cardiovascular:     Rate and Rhythm: Normal rate and regular rhythm.     Heart sounds: Normal heart sounds.  Pulmonary:     Effort: Pulmonary effort is normal.     Breath sounds: Normal breath sounds.     Comments: Lungs clear to auscultation Musculoskeletal:     Cervical back: Neck supple.  Skin:    General: Skin is warm.     Comments: No hives noted  Neurological:     Mental Status: She is alert and oriented to person, place, and time.    Diagnostics: FVC 0.94 L, FEV1 0.52 L.  Predicted FVC 2.04 L, FEV1 1.58 L.  Spirometry is down from previous spirometry.  Spirometry indicates severe restriction and moderate airway obstruction.  She refused bronchodilator therapy and post spirometry in the office today despite counseling about FEV1 and FVC being down from previous spirometry.  Assessment and Plan: 1. Severe persistent asthma without complication   2. Anaphylactic shock due to food, subsequent encounter   3. Other allergic rhinitis   4. Seasonal allergic conjunctivitis   5. Acute urticaria   6. Gastroesophageal reflux disease without esophagitis   7. Essential hypertension   8. Type 2 diabetes mellitus with other specified complication, without long-term current use of insulin (HCC)     Meds ordered this encounter  Medications  . montelukast (SINGULAIR) 10 MG tablet    Sig: Take 1 tablet (10 mg total) by mouth at bedtime.    Dispense:  90 tablet    Refill:  3    Dispense 90 days supply.  . Tiotropium Bromide Monohydrate (SPIRIVA RESPIMAT) 1.25 MCG/ACT AERS    Sig: Inhale 2  puffs into the lungs every morning.    Dispense:  12 g    Refill:  3    Dispense 90 days supply  . fexofenadine (ALLEGRA) 60 MG tablet    Sig: Take 1 tablet (60 mg total) by mouth daily as needed for allergies or rhinitis.    Dispense:  90 tablet    Refill:  0    Patient Instructions  Asthma Continue Advair Diskus 500-50 -1 puff every 12 hours to prevent coughing or wheezing- name brand only, she failed generic Continue montelukast 10 mg once a day to prevent cough or wheeze Spiriva Respimat 1.25 mcg  -2 puffs every morning to prevent cough and wheeze Use Ventolin 2  puffs every 4 hours if needed for wheezing or coughing or instead albuterol 0.083% 1 unit dose every 4 hours if needed Continue Xolair injections 150 mg once every 4 weeks and have access to an epinephrine device If your asthma symptoms progress, you may start prednisone 10 mg taper given at office.  Take 1 tablet twice a day for 4 days then on the fifth day take 1 tablet and stop.  If you use this please call the clinic.  Allergic rhinitis Continue Zyrtec 10 mg-take 1 tablet once a day if needed for runny nose Continue Nasonex 1 spray per nostril twice a day if needed for stuffy nose Consider nasal saline rinses as needed for nasal symptoms Continue allergen avoidance measures  Allergic conjunctivitis Continue Optivar eye drops one drop in each eye twice a day as needed for red, itchy eyes.  Reflux Omeprazole 40 mg-take 1 capsule once a day for reflux Continue lifestyle modifications as listed below  Food allergy Continue to avoid shellfish.  If you have an allergic reaction give Benadryl 50 mg every 4 hours and if you has life-threatening symptoms inject with EpiPen 0.3 mg  Call us if you are not doing well on this treatment plan  Continue on your other medications as listed in your chart  Follow up in the clinic in  3 months or sooner if needed   Return in about 3 months (around 11/22/2019), or if symptoms  worsen or fail to improve.    Thank you for the opportunity to care for this patient.  Please do not hesitate to contact me with questions.  Nehemiah Settle, FNP [facility]  Thank you for the opportunity to care for this patient.  Please do not hesitate to contact me with questions.  Thermon Leyland, FNP [facility]  ________________________________________________  I have provided oversight concerning [facility]'s evaluation and treatment of this patient's health issues addressed during today's encounter.  I agree with the assessment and therapeutic plan as outlined in the note.   Signed,   R Jorene Guest, MD

## 2019-08-22 ENCOUNTER — Telehealth: Payer: Self-pay | Admitting: Family

## 2019-08-22 ENCOUNTER — Encounter: Payer: Self-pay | Admitting: Family Medicine

## 2019-08-22 ENCOUNTER — Ambulatory Visit (INDEPENDENT_AMBULATORY_CARE_PROVIDER_SITE_OTHER): Payer: Medicare Other | Admitting: Family Medicine

## 2019-08-22 ENCOUNTER — Other Ambulatory Visit: Payer: Self-pay

## 2019-08-22 VITALS — BP 124/58 | HR 86 | Temp 98.9°F | Resp 18 | Ht 61.4 in | Wt 205.0 lb

## 2019-08-22 DIAGNOSIS — H1010 Acute atopic conjunctivitis, unspecified eye: Secondary | ICD-10-CM | POA: Diagnosis not present

## 2019-08-22 DIAGNOSIS — K219 Gastro-esophageal reflux disease without esophagitis: Secondary | ICD-10-CM

## 2019-08-22 DIAGNOSIS — T7800XD Anaphylactic reaction due to unspecified food, subsequent encounter: Secondary | ICD-10-CM | POA: Diagnosis not present

## 2019-08-22 DIAGNOSIS — J4550 Severe persistent asthma, uncomplicated: Secondary | ICD-10-CM

## 2019-08-22 DIAGNOSIS — J3089 Other allergic rhinitis: Secondary | ICD-10-CM | POA: Diagnosis not present

## 2019-08-22 DIAGNOSIS — L508 Other urticaria: Secondary | ICD-10-CM | POA: Insufficient documentation

## 2019-08-22 DIAGNOSIS — I10 Essential (primary) hypertension: Secondary | ICD-10-CM

## 2019-08-22 DIAGNOSIS — E1169 Type 2 diabetes mellitus with other specified complication: Secondary | ICD-10-CM

## 2019-08-22 MED ORDER — FEXOFENADINE HCL 60 MG PO TABS: 60.0000 mg | ORAL_TABLET | Freq: Every day | ORAL | 0 refills | Status: DC | PRN

## 2019-08-22 MED ORDER — SPIRIVA RESPIMAT 1.25 MCG/ACT IN AERS: 2.0000 | INHALATION_SPRAY | RESPIRATORY_TRACT | 3 refills | Status: DC

## 2019-08-22 MED ORDER — MONTELUKAST SODIUM 10 MG PO TABS: 10.0000 mg | ORAL_TABLET | Freq: Every day | ORAL | 3 refills | Status: DC

## 2019-08-22 NOTE — Patient Instructions (Addendum)
Asthma Continue Advair Diskus 500-50 -1 puff every 12 hours to prevent coughing or wheezing- name brand only, she failed generic Continue montelukast 10 mg once a day to prevent cough or wheeze Spiriva Respimat 1.25 mcg  -2 puffs every morning to prevent cough and wheeze Use Ventolin 2 puffs every 4 hours if needed for wheezing or coughing or instead albuterol 0.083% 1 unit dose every 4 hours if needed Continue Xolair injections 150 mg once every 4 weeks and have access to an epinephrine device If your asthma is to flare you may start prednisone 10 mg taper given at office.  Take 1 tablet twice a day for 4 days then on the fifth day take 1 tablet and stop.  If you use this please call the clinic. Will contact you tomorrow and see how you are doing.  Allergic rhinitis Start Allegra 60 mg- take one tablet once a day as needed for runny nose Stop Zyrtec 10 mg Continue Nasonex 1 spray per nostril twice a day if needed for stuffy nose Consider nasal saline rinses as needed for nasal symptoms Continue allergen avoidance measures  Allergic conjunctivitis Continue Optivar eye drops one drop in each eye twice a day as needed for red, itchy eyes.  Reflux Omeprazole 40 mg-take 1 capsule once a day for reflux Continue lifestyle modifications as listed below  Food allergy Continue to avoid shellfish.  If you have an allergic reaction give Benadryl 50 mg every 4 hours and if you has life-threatening symptoms inject with EpiPen 0.3 mg  Call us if you are not doing well on this treatment plan  Continue on your other medications as listed in your chart  Follow up in the clinic in  3 months or sooner if needed   Lifestyle Changes for Controlling GERD When you have GERD, stomach acid feels as if it's backing up toward your mouth. Whether or not you take medication to control your GERD, your symptoms can often be improved with lifestyle changes.   Raise Your Head  Reflux is more likely to strike  when you're lying down flat, because stomach fluid can  flow backward more easily. Raising the head of your bed 4-6 inches can help. To do this:  Slide blocks or books under the legs at the head of your bed. Or, place a wedge under  the mattress. Many foam stores can make a suitable wedge for you. The wedge  should run from your waist to the top of your head.  Don't just prop your head on several pillows. This increases pressure on your  stomach. It can make GERD worse.  Watch Your Eating Habits Certain foods may increase the acid in your stomach or relax the lower esophageal sphincter, making GERD more likely. It's best to avoid the following:  Coffee, tea, and carbonated drinks (with and without caffeine)  Fatty, fried, or spicy food  Mint, chocolate, onions, and tomatoes  Any other foods that seem to irritate your stomach or cause you pain  Relieve the Pressure  Eat smaller meals, even if you have to eat more often.  Don't lie down right after you eat. Wait a few hours for your stomach to empty.  Avoid tight belts and tight-fitting clothes.  Lose excess weight.  Tobacco and Alcohol  Avoid smoking tobacco and drinking alcohol. They can make GERD symptoms worse.

## 2019-08-22 NOTE — Telephone Encounter (Signed)
Called and spoke with the patient to see if she is taking Combivent inhaler. She reports that she is no longer taking this medication since being on Spiriva. Instructed her that that was great because those 2 medications had the same ingredient and  that would be too much and could cause dry mouth, eyes and constipation. She verbalizes understanding.

## 2019-08-23 ENCOUNTER — Telehealth: Payer: Self-pay

## 2019-08-23 NOTE — Telephone Encounter (Signed)
Lm for pt to call us back  

## 2019-08-23 NOTE — Telephone Encounter (Signed)
Breathing is not worse but not better. She did start the prednisone.

## 2019-08-23 NOTE — Telephone Encounter (Signed)
Jennifer Settle, FNP  [facility]  Please contact this patient on Thursday (08/23/2019) and see how her asthma is doing and if she has had to start the prednisone that she was given on 08/22/2019. Thank you.    Lm for pt to call us back

## 2019-08-23 NOTE — Telephone Encounter (Signed)
Please call her back and let her know that if her symptoms do not get any better to please call our office. Thank you!

## 2019-09-11 ENCOUNTER — Other Ambulatory Visit: Payer: Self-pay

## 2019-09-11 ENCOUNTER — Ambulatory Visit (INDEPENDENT_AMBULATORY_CARE_PROVIDER_SITE_OTHER): Payer: Medicare Other

## 2019-09-11 DIAGNOSIS — J4540 Moderate persistent asthma, uncomplicated: Secondary | ICD-10-CM

## 2019-10-09 ENCOUNTER — Ambulatory Visit (INDEPENDENT_AMBULATORY_CARE_PROVIDER_SITE_OTHER): Payer: Medicare Other

## 2019-10-09 ENCOUNTER — Other Ambulatory Visit: Payer: Self-pay

## 2019-10-09 DIAGNOSIS — J4540 Moderate persistent asthma, uncomplicated: Secondary | ICD-10-CM | POA: Diagnosis not present

## 2019-11-06 ENCOUNTER — Other Ambulatory Visit: Payer: Self-pay

## 2019-11-06 ENCOUNTER — Ambulatory Visit (INDEPENDENT_AMBULATORY_CARE_PROVIDER_SITE_OTHER): Payer: Medicare Other

## 2019-11-06 DIAGNOSIS — J4540 Moderate persistent asthma, uncomplicated: Secondary | ICD-10-CM | POA: Diagnosis not present

## 2019-11-09 ENCOUNTER — Telehealth: Payer: Self-pay | Admitting: Family Medicine

## 2019-11-09 NOTE — Telephone Encounter (Signed)
Pt informed of this and will follow up once they make the booster available

## 2019-11-09 NOTE — Telephone Encounter (Signed)
Can you please let this patient know that while there is FDA backing of a booster injection for people who are immunocompromised there has not been any instruction about how to move forward with these booster injections. Stay tuned for further information. Thank you

## 2019-11-09 NOTE — Telephone Encounter (Signed)
Pt. ask if she qualifies for vaccine booster because of her asthma.

## 2019-11-12 ENCOUNTER — Other Ambulatory Visit: Payer: Self-pay | Admitting: *Deleted

## 2019-11-12 MED ORDER — XOLAIR 150 MG ~~LOC~~ SOLR: 150.0000 mg | SUBCUTANEOUS | 11 refills | Status: DC

## 2019-12-04 ENCOUNTER — Ambulatory Visit (INDEPENDENT_AMBULATORY_CARE_PROVIDER_SITE_OTHER): Payer: Medicare Other

## 2019-12-04 ENCOUNTER — Other Ambulatory Visit: Payer: Self-pay

## 2019-12-04 DIAGNOSIS — J4540 Moderate persistent asthma, uncomplicated: Secondary | ICD-10-CM

## 2020-01-01 ENCOUNTER — Ambulatory Visit: Payer: Self-pay

## 2020-01-03 ENCOUNTER — Ambulatory Visit: Payer: Self-pay

## 2020-01-07 ENCOUNTER — Other Ambulatory Visit: Payer: Self-pay

## 2020-01-07 ENCOUNTER — Ambulatory Visit (INDEPENDENT_AMBULATORY_CARE_PROVIDER_SITE_OTHER): Payer: Medicare Other

## 2020-01-07 DIAGNOSIS — J4540 Moderate persistent asthma, uncomplicated: Secondary | ICD-10-CM

## 2020-01-16 ENCOUNTER — Ambulatory Visit: Payer: Medicare Other | Admitting: Family Medicine

## 2020-01-16 NOTE — Progress Notes (Deleted)
   [address] [place] [address] Dept: [phone]  FOLLOW UP NOTE  Patient ID: DINARA LUPU, female    DOB: [birth year]  Age: 69 y.o. MRN: [id number] Date of Office Visit: 01/16/2020  Assessment  Chief Complaint: No chief complaint on file.  HPI Simone Curia    Drug Allergies:  Allergies  Allergen Reactions  . Tapazole [Methimazole] Swelling and Rash    HIVES, caused lips to swell  . Iodine Hives and Swelling  . Shellfish Allergy   . Wixela Inhub [Fluticasone-Salmeterol]     Failed trial of    Physical Exam: There were no vitals taken for this visit.   Physical Exam  Diagnostics:    Assessment and Plan: No diagnosis found.  No orders of the defined types were placed in this encounter.   There are no Patient Instructions on file for this visit.  No follow-ups on file.    Thank you for the opportunity to care for this patient.  Please do not hesitate to contact me with questions.  Thermon Leyland, FNP [facility]

## 2020-02-04 NOTE — Progress Notes (Addendum)
[address] [place] [address] Dept: [phone]  FOLLOW UP NOTE  Patient ID: Jennifer Pruitt, female    DOB: 1950/11/02  Age: 69 y.o. MRN: [id number] Date of Office Visit: 02/05/2020  Assessment  Chief Complaint: Asthma  HPI Jennifer Pruitt is a 69 year old female who presents to the clinic for follow-up visit.  She was last seen in this clinic 08/22/2019 for evaluation of, allergic rhinitis, allergic conjunctivitis, chronic urticaria, reflux, food allergy to shellfish, and type 2 diabetes.  At today's visit she reports her asthma has been moderately well controlled or " the same as normal" with symptoms including occasional shortness of breath which is worse with activity and wheezing occasionally with activity.  She denies cough.  She continues Advair 500-1 puff twice a day, Spiriva 1.25 mcg 2 puffs once a day, montelukast 10 mg once a day, albuterol 1-2 times a week with relief of symptoms, and Xolair 300 mg once every 4 weeks.  She reports a significant decrease in her symptoms of asthma while continuing on Xolair injections.  Jennifer Pruitt reports that she feels completely satisfied with her asthma medication regimen and symptom reduction at this time.  Allergic rhinitis is reported as moderately well controlled with symptoms including nasal congestion and sneezing that began last week with the cold weather.  She continues Allegra daily, Nasonex as needed, and saline nasal rinse as needed.  Allergic conjunctivitis is reported as moderately well controlled with watery itchy eyes occurring last week for which he uses Optivar with relief of symptoms.  Urticaria is reported as well controlled with her last breakout occurring several months ago.  Reflux is reported as well controlled with omeprazole 40 mg once a day and dietary interventions including avoiding foods that irritate her such as cucumbers, collard's, and any spicy foods.  She continues to avoid shellfish with 1 accidental  ingestion occurring on Mother's Day when she was accidentally served a meal containing shellfish.  She took Benadryl and did not experience any adverse symptoms at that time.  Her current medications are listed in the chart.   Drug Allergies:  Allergies  Allergen Reactions  . Tapazole [Methimazole] Swelling and Rash    HIVES, caused lips to swell  . Iodine Hives and Swelling  . Shellfish Allergy   . Wixela Inhub [Fluticasone-Salmeterol]     Failed trial of    Physical Exam: BP 110/72   Pulse 100   Temp 98.6 F (37 C) (Temporal)   Resp 16   Ht 5' 1.5" (1.562 m)   Wt 201 lb (91.2 kg)   SpO2 96%   BMI 37.36 kg/m    Physical Exam Vitals reviewed.  Constitutional:      Appearance: Normal appearance.  HENT:     Head: Normocephalic and atraumatic.     Right Ear: Tympanic membrane normal.     Left Ear: Tympanic membrane normal.     Nose:     Comments: Bilateral nares slightly erythematous with clear nasal drainage noted.  Pharynx normal.  Ears normal.  Eyes normal.    Mouth/Throat:     Pharynx: Oropharynx is clear.  Eyes:     Conjunctiva/sclera: Conjunctivae normal.  Cardiovascular:     Rate and Rhythm: Normal rate and regular rhythm.     Heart sounds: Normal heart sounds. No murmur heard.   Pulmonary:     Effort: Pulmonary effort is normal.     Breath sounds: Normal breath sounds.     Comments: Lungs clear to  auscultation Musculoskeletal:        General: Normal range of motion.     Cervical back: Normal range of motion and neck supple.  Skin:    General: Skin is warm and dry.  Neurological:     Mental Status: She is alert and oriented to person, place, and time.  Psychiatric:        Mood and Affect: Mood normal.        Behavior: Behavior normal.        Thought Content: Thought content normal.        Judgment: Judgment normal.     Diagnostics: FVC 1.28, FEV1 0.63.  Predicted FVC 2.09, predicted FEV1 1.62.  Spirometry indicates moderate restriction and moderate  airway obstruction.  This is consistent with previous spirometry readings.  Assessment and Plan: 1. Severe persistent asthma, unspecified whether complicated   2. Anaphylactic shock due to food, subsequent encounter   3. Chronic urticaria   4. Seasonal allergic conjunctivitis   5. Other allergic rhinitis   6. Gastroesophageal reflux disease without esophagitis   7. Type 2 diabetes mellitus with other specified complication, without long-term current use of insulin (HCC)     Meds ordered this encounter  Medications  . Tiotropium Bromide Monohydrate (SPIRIVA RESPIMAT) 1.25 MCG/ACT AERS    Sig: Inhale 2 puffs into the lungs daily.    Dispense:  4 g    Refill:  3  . montelukast (SINGULAIR) 10 MG tablet    Sig: Take 1 tablet (10 mg total) by mouth at bedtime.    Dispense:  90 tablet    Refill:  3    Dispense 90 days supply.    Patient Instructions  Asthma Continue Advair Diskus 500-50 -1 puff every 12 hours to prevent coughing or wheezing- name brand only, she failed generic Continue montelukast 10 mg once a day to prevent cough or wheeze Continue Spiriva Respimat 1.25 mcg  -2 puffs every morning to prevent cough and wheeze Use albuterol 2 puffs every 4 hours if needed for wheezing or coughing or instead albuterol 0.083% 1 unit dose every 4 hours if needed Continue Xolair injections 150 mg once every 4 weeks and have access to an epinephrine device If your asthma is to flare you may start prednisone 10 mg taper given at office.  Take 1 tablet twice a day for 4 days then on the fifth day take 1 tablet and stop.  If you use this continue to monitor your blood sugar closely and please call the clinic.  Allergic rhinitis Continue Allegra 60 mg- take one tablet once a day as needed for runny nose Continue Nasonex 1 spray per nostril twice a day if needed for stuffy nose Consider nasal saline rinses as needed for nasal symptoms Continue allergen avoidance measures  Allergic  conjunctivitis Continue Optivar eye drops one drop in each eye twice a day as needed for red, itchy eyes.  Reflux Omeprazole 40 mg-take 1 capsule once a day for reflux Continue lifestyle modifications as listed below  Food allergy Continue to avoid shellfish.  If you have an allergic reaction give Benadryl 50 mg every 4 hours and if you has life-threatening symptoms inject with EpiPen 0.3 mg  Hives (urticaria)  . Cetirizine (Zyrtec) 10mg  twice a day and famotidine (Pepcid) 20 mg twice a day. If no symptoms for 7-14 days then decrease to. . Cetirizine (Zyrtec) 10mg  twice a day and famotidine (Pepcid) 20 mg once a day.  If no symptoms for 7-14 days  then decrease to. . Cetirizine (Zyrtec) 10mg  twice a day.  If no symptoms for 7-14 days then decrease to. . Cetirizine (Zyrtec) 10mg  once a day.  May use Benadryl (diphenhydramine) as needed for breakthrough hives       If symptoms return, then step up dosage  Keep a detailed symptom journal including foods eaten, contact with allergens, medications taken, weather changes.   Diabetes Keep track of how often you are using prednisone as this can change your blood sugar control.   Call if you are not doing well on this treatment plan  Continue on your other medications as listed in your chart  Follow up in the clinic in  6 months or sooner if needed    Return in about 6 months (around 08/04/2020), or if symptoms worsen or fail to improve.    Thank you for the opportunity to care for this patient.  Please do not hesitate to contact me with questions.  [place], FNP [facility]  ________________________________________________  I have provided oversight concerning [facility]'s evaluation and treatment of this patient's health issues addressed during today's encounter.  I agree with the assessment and therapeutic plan as outlined in the note.   Signed,   R [facility], MD

## 2020-02-04 NOTE — Patient Instructions (Signed)
Asthma Continue Advair Diskus 500-50 -1 puff every 12 hours to prevent coughing or wheezing- name brand only, she failed generic Continue montelukast 10 mg once a day to prevent cough or wheeze Continue Spiriva Respimat 1.25 mcg  -2 puffs every morning to prevent cough and wheeze Use albuterol 2 puffs every 4 hours if needed for wheezing or coughing or instead albuterol 0.083% 1 unit dose every 4 hours if needed Continue Xolair injections 150 mg once every 4 weeks and have access to an epinephrine device If your asthma is to flare you may start prednisone 10 mg taper given at office.  Take 1 tablet twice a day for 4 days then on the fifth day take 1 tablet and stop.  If you use this continue to monitor your blood sugar closely and please call the clinic.  Allergic rhinitis Continue Allegra 60 mg- take one tablet once a day as needed for runny nose Continue Nasonex 1 spray per nostril twice a day if needed for stuffy nose Consider nasal saline rinses as needed for nasal symptoms Continue allergen avoidance measures  Allergic conjunctivitis Continue Optivar eye drops one drop in each eye twice a day as needed for red, itchy eyes.  Reflux Omeprazole 40 mg-take 1 capsule once a day for reflux Continue lifestyle modifications as listed below  Food allergy Continue to avoid shellfish.  If you have an allergic reaction give Benadryl 50 mg every 4 hours and if you has life-threatening symptoms inject with EpiPen 0.3 mg  Hives (urticaria)   Cetirizine (Zyrtec) 10mg  twice a day and famotidine (Pepcid) 20 mg twice a day. If no symptoms for 7-14 days then decrease to  Cetirizine (Zyrtec) 10mg  twice a day and famotidine (Pepcid) 20 mg once a day.  If no symptoms for 7-14 days then decrease to  Cetirizine (Zyrtec) 10mg  twice a day.  If no symptoms for 7-14 days then decrease to  Cetirizine (Zyrtec) 10mg  once a day.  May use Benadryl (diphenhydramine) as needed for breakthrough hives       If  symptoms return, then step up dosage  Keep a detailed symptom journal including foods eaten, contact with allergens, medications taken, weather changes.   Diabetes Keep track of how often you are using prednisone as this can change your blood sugar control.   Call if you are not doing well on this treatment plan  Continue on your other medications as listed in your chart  Follow up in the clinic in  6 months or sooner if needed

## 2020-02-05 ENCOUNTER — Ambulatory Visit (INDEPENDENT_AMBULATORY_CARE_PROVIDER_SITE_OTHER): Payer: Medicare Other | Admitting: *Deleted

## 2020-02-05 ENCOUNTER — Ambulatory Visit: Payer: Medicare Other | Admitting: Family Medicine

## 2020-02-05 ENCOUNTER — Other Ambulatory Visit: Payer: Self-pay

## 2020-02-05 ENCOUNTER — Encounter: Payer: Self-pay | Admitting: Family Medicine

## 2020-02-05 VITALS — BP 110/72 | HR 100 | Temp 98.6°F | Resp 16 | Ht 61.5 in | Wt 201.0 lb

## 2020-02-05 DIAGNOSIS — E1169 Type 2 diabetes mellitus with other specified complication: Secondary | ICD-10-CM

## 2020-02-05 DIAGNOSIS — H1010 Acute atopic conjunctivitis, unspecified eye: Secondary | ICD-10-CM

## 2020-02-05 DIAGNOSIS — J4540 Moderate persistent asthma, uncomplicated: Secondary | ICD-10-CM

## 2020-02-05 DIAGNOSIS — T7800XD Anaphylactic reaction due to unspecified food, subsequent encounter: Secondary | ICD-10-CM

## 2020-02-05 DIAGNOSIS — J4550 Severe persistent asthma, uncomplicated: Secondary | ICD-10-CM

## 2020-02-05 DIAGNOSIS — L508 Other urticaria: Secondary | ICD-10-CM

## 2020-02-05 DIAGNOSIS — K219 Gastro-esophageal reflux disease without esophagitis: Secondary | ICD-10-CM

## 2020-02-05 DIAGNOSIS — J3089 Other allergic rhinitis: Secondary | ICD-10-CM

## 2020-02-05 MED ORDER — MONTELUKAST SODIUM 10 MG PO TABS: 10.0000 mg | ORAL_TABLET | Freq: Every day | ORAL | 3 refills | Status: DC

## 2020-02-05 MED ORDER — SPIRIVA RESPIMAT 1.25 MCG/ACT IN AERS: 2.0000 | INHALATION_SPRAY | RESPIRATORY_TRACT | 3 refills | Status: DC

## 2020-03-04 ENCOUNTER — Ambulatory Visit (INDEPENDENT_AMBULATORY_CARE_PROVIDER_SITE_OTHER): Payer: Medicare Other

## 2020-03-04 ENCOUNTER — Other Ambulatory Visit: Payer: Self-pay

## 2020-03-04 DIAGNOSIS — J4540 Moderate persistent asthma, uncomplicated: Secondary | ICD-10-CM | POA: Diagnosis not present

## 2020-04-01 ENCOUNTER — Ambulatory Visit (INDEPENDENT_AMBULATORY_CARE_PROVIDER_SITE_OTHER): Payer: Medicare Other

## 2020-04-01 DIAGNOSIS — J4540 Moderate persistent asthma, uncomplicated: Secondary | ICD-10-CM | POA: Diagnosis not present

## 2020-04-29 ENCOUNTER — Other Ambulatory Visit: Payer: Self-pay

## 2020-04-29 ENCOUNTER — Ambulatory Visit (INDEPENDENT_AMBULATORY_CARE_PROVIDER_SITE_OTHER): Payer: Medicare Other

## 2020-04-29 DIAGNOSIS — J4540 Moderate persistent asthma, uncomplicated: Secondary | ICD-10-CM | POA: Diagnosis not present

## 2020-05-27 ENCOUNTER — Ambulatory Visit (INDEPENDENT_AMBULATORY_CARE_PROVIDER_SITE_OTHER): Payer: Medicare Other

## 2020-05-27 ENCOUNTER — Other Ambulatory Visit: Payer: Self-pay

## 2020-05-27 DIAGNOSIS — J4540 Moderate persistent asthma, uncomplicated: Secondary | ICD-10-CM

## 2020-06-24 ENCOUNTER — Ambulatory Visit: Payer: Self-pay

## 2020-06-27 ENCOUNTER — Ambulatory Visit (INDEPENDENT_AMBULATORY_CARE_PROVIDER_SITE_OTHER): Payer: Medicare Other | Admitting: *Deleted

## 2020-06-27 ENCOUNTER — Other Ambulatory Visit: Payer: Self-pay

## 2020-06-27 DIAGNOSIS — J4540 Moderate persistent asthma, uncomplicated: Secondary | ICD-10-CM | POA: Diagnosis not present

## 2020-07-29 ENCOUNTER — Other Ambulatory Visit: Payer: Self-pay

## 2020-07-29 ENCOUNTER — Ambulatory Visit (INDEPENDENT_AMBULATORY_CARE_PROVIDER_SITE_OTHER): Payer: Medicare Other

## 2020-07-29 DIAGNOSIS — J4540 Moderate persistent asthma, uncomplicated: Secondary | ICD-10-CM

## 2020-08-18 ENCOUNTER — Ambulatory Visit: Payer: Medicare Other | Admitting: Family Medicine

## 2020-08-26 ENCOUNTER — Ambulatory Visit (INDEPENDENT_AMBULATORY_CARE_PROVIDER_SITE_OTHER): Payer: Medicare Other

## 2020-08-26 ENCOUNTER — Other Ambulatory Visit: Payer: Self-pay

## 2020-08-26 DIAGNOSIS — J4540 Moderate persistent asthma, uncomplicated: Secondary | ICD-10-CM | POA: Diagnosis not present

## 2020-09-01 ENCOUNTER — Ambulatory Visit: Payer: Medicare Other | Admitting: Family Medicine

## 2020-09-01 NOTE — Progress Notes (Deleted)
   [address] [place] [address] Dept: [phone]  FOLLOW UP NOTE  Patient ID: Jennifer Pruitt, female    DOB: January 05, 1951  Age: 70 y.o. MRN: [id number] Date of Office Visit: 09/01/2020  Assessment  Chief Complaint: No chief complaint on file.  HPI Jennifer Pruitt    Drug Allergies:  Allergies  Allergen Reactions  . Tapazole [Methimazole] Swelling and Rash    HIVES, caused lips to swell  . Iodine Hives and Swelling  . Shellfish Allergy   . Wixela Inhub [Fluticasone-Salmeterol]     Failed trial of    Physical Exam: There were no vitals taken for this visit.   Physical Exam  Diagnostics:    Assessment and Plan: No diagnosis found.  No orders of the defined types were placed in this encounter.   There are no Patient Instructions on file for this visit.  No follow-ups on file.    Thank you for the opportunity to care for this patient.  Please do not hesitate to contact me with questions.  Thermon Leyland, FNP [facility]

## 2020-09-23 ENCOUNTER — Ambulatory Visit (INDEPENDENT_AMBULATORY_CARE_PROVIDER_SITE_OTHER): Payer: Medicare Other

## 2020-09-23 ENCOUNTER — Other Ambulatory Visit: Payer: Self-pay

## 2020-09-23 DIAGNOSIS — J4540 Moderate persistent asthma, uncomplicated: Secondary | ICD-10-CM

## 2020-09-25 ENCOUNTER — Other Ambulatory Visit: Payer: Self-pay | Admitting: Family Medicine

## 2020-09-25 ENCOUNTER — Telehealth: Payer: Self-pay | Admitting: Family Medicine

## 2020-09-25 MED ORDER — SPIRIVA RESPIMAT 1.25 MCG/ACT IN AERS: 2.0000 | INHALATION_SPRAY | RESPIRATORY_TRACT | 0 refills | Status: DC

## 2020-09-25 NOTE — Telephone Encounter (Signed)
Informed pt of refill as she is due for office visit she made and appointment for aug 3rd sent in refill after verifying pharmacy

## 2020-09-25 NOTE — Telephone Encounter (Signed)
Pt request refill for Spiriva

## 2020-10-21 ENCOUNTER — Ambulatory Visit: Payer: Self-pay

## 2020-10-27 NOTE — Progress Notes (Signed)
[address] [place] [address] Dept: [phone]  FOLLOW UP NOTE  Patient ID: Jennifer Pruitt, female    DOB: 11-02-1950  Age: 70 y.o. MRN: [id number] Date of Office Visit: 10/28/2020  Assessment  Chief Complaint: Asthma  HPI Jennifer Pruitt is a 70 year old female who presents to the clinic for follow-up visit.  She was last seen in this clinic on 02/05/2020 for evaluation of asthma, allergic rhinitis, allergic conjunctivitis, urticaria, reflux, and food allergy to shellfish.  She does have a history of diabetes without insulin use.  At today's visit, she reports her asthma has been poorly controlled for the last 2 weeks with symptoms including shortness of breath with activity and wheeze with activity.  She denies cough with activity or rest.  She continues montelukast 10 mg once a day, Advair 501 puff twice a day, and has been out of Spiriva 1.25 mcg of which she usually uses 2 puffs once a day.  She continues Xolair 150 mg once every 4 weeks with no large or local reactions.  She reports a significant decrease in her symptoms of asthma while continuing on Xolair.  Allergic rhinitis is reported as well controlled with Allegra 60 mg once a day and Nasonex as needed.  She is not currently using a nasal saline rinse.  Allergic conjunctivitis is reported as moderately well controlled with red and itchy eyes occurring for which she uses Optivar with relief of symptoms.  Urticaria is reported as well controlled with no breakouts since her last visit to this clinic.  She continues Allegra daily and famotidine as needed.  She continues Xolair injections 150 mg once every 4 weeks.  Reflux is reported as well controlled with omeprazole 40 mg once a day.  She continues to avoid spicy food.  She continues to avoid shellfish with no accidental ingestion or EpiPen use since her last visit to this clinic.  Her current medications are listed in the chart. Of note, she is refusing spirometry at  today's visit due to asthma symptoms.  She agrees to perform spirometry reading at her next Xolair injection next month.   Drug Allergies:  Allergies  Allergen Reactions   Tapazole [Methimazole] Swelling and Rash    HIVES, caused lips to swell   Iodine Hives and Swelling   Shellfish Allergy    Wixela Inhub [Fluticasone-Salmeterol]     Failed trial of    Physical Exam: BP (!) 142/68   Pulse 83   Temp (!) 97.5 F (36.4 C) (Temporal)   Resp 20   Ht 5' 0.5" (1.537 m)   Wt 205 lb (93 kg)   SpO2 95%   BMI 39.38 kg/m    Physical Exam Vitals reviewed.  Constitutional:      Appearance: Normal appearance.  HENT:     Head: Normocephalic and atraumatic.     Right Ear: Tympanic membrane normal.     Left Ear: Tympanic membrane normal.     Nose:     Comments: Bilateral nares slightly erythematous with clear nasal drainage noted.  Pharynx normal.  Ears normal.  Eyes normal.    Mouth/Throat:     Pharynx: Oropharynx is clear.  Eyes:     Conjunctiva/sclera: Conjunctivae normal.  Cardiovascular:     Rate and Rhythm: Normal rate and regular rhythm.     Heart sounds: Normal heart sounds. No murmur heard. Pulmonary:     Effort: Pulmonary effort is normal.     Breath sounds: Normal breath sounds.  Comments: Lungs clear to auscultation Musculoskeletal:        General: Normal range of motion.     Cervical back: Normal range of motion and neck supple.  Skin:    General: Skin is warm and dry.  Neurological:     Mental Status: She is alert and oriented to person, place, and time.  Psychiatric:        Mood and Affect: Mood normal.        Behavior: Behavior normal.        Thought Content: Thought content normal.        Judgment: Judgment normal.    Diagnostics: Patient refused due to asthma symptoms.  She agrees to perform spirometry reading at her next Xolair injection next month  Assessment and Plan: 1. Moderate persistent asthma with acute exacerbation   2. Other allergic  rhinitis   3. Seasonal allergic conjunctivitis   4. Chronic urticaria   5. Gastroesophageal reflux disease without esophagitis   6. Type 2 diabetes mellitus with other specified complication, without long-term current use of insulin (HCC)   7. Anaphylactic shock due to food, subsequent encounter     Meds ordered this encounter  Medications   Tiotropium Bromide Monohydrate (SPIRIVA RESPIMAT) 1.25 MCG/ACT AERS    Sig: Inhale 2 puffs into the lungs daily.    Dispense:  4 g    Refill:  5   EPINEPHrine (EPIPEN 2-PAK) 0.3 mg/0.3 mL IJ SOAJ injection    Sig: Use as directed for severe allergic reactions    Dispense:  4 each    Refill:  3   albuterol (PROAIR HFA) 108 (90 Base) MCG/ACT inhaler    Sig: Inhale 2 puffs into the lungs every 4 (four) hours as needed for wheezing or shortness of breath.    Dispense:  24 g    Refill:  1    Dispense 90 days supply     Patient Instructions  Asthma Continue Advair Diskus 500-50 -1 puff every 12 hours to prevent coughing or wheezing- name brand only, she failed generic Continue montelukast 10 mg once a day to prevent cough or wheeze Continue Spiriva Respimat 1.25 mcg  -2 puffs every morning to prevent cough and wheeze Use albuterol 2 puffs every 4 hours if needed for wheezing or coughing or instead albuterol 0.083% 1 unit dose every 4 hours if needed Continue Xolair injections 150 mg once every 4 weeks and have access to an epinephrine device Begin prednisone 10 mg tablets. Take 1 tablet twice a day for 4 days then on the fifth day take 1 tablet and stop.  Continue to monitor your blood sugar closely while taking prednisone We will get a spirometry reading mext month when you come in for your Xolair injection  Allergic rhinitis Continue Allegra 60 mg- take one tablet once a day as needed for runny nose Continue Nasonex 1 spray per nostril twice a day if needed for stuffy nose Consider nasal saline rinses as needed for nasal symptoms Continue  allergen avoidance measures  Allergic conjunctivitis Continue Optivar eye drops one drop in each eye twice a day as needed for red, itchy eyes.  Reflux Omeprazole 40 mg-take 1 capsule once a day for reflux Continue lifestyle modifications as listed below  Food allergy Continue to avoid shellfish.  If you have an allergic reaction give Benadryl 50 mg every 4 hours and if you has life-threatening symptoms inject with EpiPen 0.3 mg  Hives (urticaria)  Allegra 60 mg twice a day  and famotidine (Pepcid) 20 mg twice a day. If no symptoms for 7-14 days then decrease to. Allegra 60 mg twice a day and famotidine (Pepcid) 20 mg once a day.  If no symptoms for 7-14 days then decrease to. Allegra 60 mg  twice a day.  If no symptoms for 7-14 days then decrease to. Allegra 60 mg once a day.  May use Benadryl (diphenhydramine) as needed for breakthrough hives       If symptoms return, then step up dosage  Keep a detailed symptom journal including foods eaten, contact with allergens, medications taken, weather changes.  Continue Xolair injections and have access to an epinephrine auto-injector set  Diabetes Keep track of how often you are using prednisone as this can change your blood sugar control.   Call us if you are not doing well on this treatment plan  Continue on your other medications as listed in your chart  Follow up in the clinic in  6 months or sooner if needed   Return in about 6 months (around 04/30/2021), or if symptoms worsen or fail to improve.    Thank you for the opportunity to care for this patient.  Please do not hesitate to contact me with questions.  Thermon Leyland, FNP [facility]

## 2020-10-27 NOTE — Patient Instructions (Addendum)
Asthma Continue Advair Diskus 500-50 -1 puff every 12 hours to prevent coughing or wheezing- name brand only, she failed generic Continue montelukast 10 mg once a day to prevent cough or wheeze Continue Spiriva Respimat 1.25 mcg  -2 puffs every morning to prevent cough and wheeze Use albuterol 2 puffs every 4 hours if needed for wheezing or coughing or instead albuterol 0.083% 1 unit dose every 4 hours if needed Continue Xolair injections 150 mg once every 4 weeks and have access to an epinephrine device Begin prednisone 10 mg tablets. Take 1 tablet twice a day for 4 days then on the fifth day take 1 tablet and stop.  Continue to monitor your blood sugar closely while taking prednisone We will get a spirometry reading mext month when you come in for your Xolair injection  Allergic rhinitis Continue Allegra 60 mg- take one tablet once a day as needed for runny nose Continue Nasonex 1 spray per nostril twice a day if needed for stuffy nose Consider nasal saline rinses as needed for nasal symptoms Continue allergen avoidance measures  Allergic conjunctivitis Continue Optivar eye drops one drop in each eye twice a day as needed for red, itchy eyes.  Reflux Omeprazole 40 mg-take 1 capsule once a day for reflux Continue lifestyle modifications as listed below  Food allergy Continue to avoid shellfish.  If you have an allergic reaction give Benadryl 50 mg every 4 hours and if you has life-threatening symptoms inject with EpiPen 0.3 mg  Hives (urticaria)  Allegra 60 mg twice a day and famotidine (Pepcid) 20 mg twice a day. If no symptoms for 7-14 days then decrease to. Allegra 60 mg twice a day and famotidine (Pepcid) 20 mg once a day.  If no symptoms for 7-14 days then decrease to. Allegra 60 mg  twice a day.  If no symptoms for 7-14 days then decrease to. Allegra 60 mg once a day.  May use Benadryl (diphenhydramine) as needed for breakthrough hives       If symptoms return, then step up  dosage  Keep a detailed symptom journal including foods eaten, contact with allergens, medications taken, weather changes.  Continue Xolair injections and have access to an epinephrine auto-injector set  Diabetes Keep track of how often you are using prednisone as this can change your blood sugar control.   Call us if you are not doing well on this treatment plan  Continue on your other medications as listed in your chart  Follow up in the clinic in  6 months or sooner if needed

## 2020-10-28 ENCOUNTER — Encounter: Payer: Self-pay | Admitting: Family Medicine

## 2020-10-28 ENCOUNTER — Ambulatory Visit: Payer: Medicare Other | Admitting: Family Medicine

## 2020-10-28 ENCOUNTER — Other Ambulatory Visit: Payer: Self-pay

## 2020-10-28 ENCOUNTER — Ambulatory Visit (INDEPENDENT_AMBULATORY_CARE_PROVIDER_SITE_OTHER): Payer: Medicare Other

## 2020-10-28 VITALS — BP 142/68 | HR 83 | Temp 97.5°F | Resp 20 | Ht 60.5 in | Wt 205.0 lb

## 2020-10-28 DIAGNOSIS — E1169 Type 2 diabetes mellitus with other specified complication: Secondary | ICD-10-CM

## 2020-10-28 DIAGNOSIS — H1010 Acute atopic conjunctivitis, unspecified eye: Secondary | ICD-10-CM

## 2020-10-28 DIAGNOSIS — T7800XD Anaphylactic reaction due to unspecified food, subsequent encounter: Secondary | ICD-10-CM

## 2020-10-28 DIAGNOSIS — J3089 Other allergic rhinitis: Secondary | ICD-10-CM

## 2020-10-28 DIAGNOSIS — K219 Gastro-esophageal reflux disease without esophagitis: Secondary | ICD-10-CM

## 2020-10-28 DIAGNOSIS — H1013 Acute atopic conjunctivitis, bilateral: Secondary | ICD-10-CM

## 2020-10-28 DIAGNOSIS — L508 Other urticaria: Secondary | ICD-10-CM

## 2020-10-28 DIAGNOSIS — J4540 Moderate persistent asthma, uncomplicated: Secondary | ICD-10-CM

## 2020-10-28 DIAGNOSIS — J4541 Moderate persistent asthma with (acute) exacerbation: Secondary | ICD-10-CM

## 2020-10-28 MED ORDER — SPIRIVA RESPIMAT 1.25 MCG/ACT IN AERS: 2.0000 | INHALATION_SPRAY | RESPIRATORY_TRACT | 5 refills | Status: DC

## 2020-10-28 MED ORDER — ALBUTEROL SULFATE HFA 108 (90 BASE) MCG/ACT IN AERS: 2.0000 | INHALATION_SPRAY | RESPIRATORY_TRACT | 1 refills | Status: DC | PRN

## 2020-10-28 MED ORDER — EPINEPHRINE 0.3 MG/0.3ML IJ SOAJ: INTRAMUSCULAR | 3 refills | Status: DC

## 2020-10-28 NOTE — Addendum Note (Signed)
Addended by: Maryjean Morn D on: 10/28/2020 11:31 AM   Modules accepted: Orders

## 2020-10-29 ENCOUNTER — Telehealth: Payer: Self-pay | Admitting: Family Medicine

## 2020-10-29 ENCOUNTER — Other Ambulatory Visit: Payer: Self-pay | Admitting: Family Medicine

## 2020-10-29 MED ORDER — FAMOTIDINE 20 MG PO TABS: ORAL_TABLET | ORAL | 5 refills | Status: DC

## 2020-10-29 NOTE — Telephone Encounter (Signed)
Pt states omeprazole and famotidine 20mg  were not sent in for her.she request a refill on both.

## 2020-10-29 NOTE — Telephone Encounter (Signed)
Omeprazole and famotidine sent to [facility] x 1 with 5 refills. Patient informed by Zella Ball.

## 2020-10-29 NOTE — Telephone Encounter (Signed)
Refills sent to pharmacy. Patient informed by Zella Ball.

## 2020-11-18 ENCOUNTER — Other Ambulatory Visit: Payer: Self-pay | Admitting: *Deleted

## 2020-11-18 MED ORDER — XOLAIR 150 MG ~~LOC~~ SOLR: 150.0000 mg | SUBCUTANEOUS | 11 refills | Status: DC

## 2020-11-24 ENCOUNTER — Other Ambulatory Visit: Payer: Self-pay | Admitting: Family Medicine

## 2020-11-25 ENCOUNTER — Ambulatory Visit (INDEPENDENT_AMBULATORY_CARE_PROVIDER_SITE_OTHER): Payer: Medicare Other

## 2020-11-25 ENCOUNTER — Other Ambulatory Visit: Payer: Self-pay

## 2020-11-25 DIAGNOSIS — J4540 Moderate persistent asthma, uncomplicated: Secondary | ICD-10-CM

## 2020-12-23 ENCOUNTER — Other Ambulatory Visit: Payer: Self-pay

## 2020-12-23 ENCOUNTER — Ambulatory Visit (INDEPENDENT_AMBULATORY_CARE_PROVIDER_SITE_OTHER): Payer: Medicare Other

## 2020-12-23 DIAGNOSIS — J4540 Moderate persistent asthma, uncomplicated: Secondary | ICD-10-CM

## 2021-01-20 ENCOUNTER — Ambulatory Visit: Payer: Medicare Other

## 2021-01-23 ENCOUNTER — Ambulatory Visit: Payer: Medicare Other

## 2021-01-27 ENCOUNTER — Other Ambulatory Visit: Payer: Self-pay

## 2021-01-27 ENCOUNTER — Ambulatory Visit (INDEPENDENT_AMBULATORY_CARE_PROVIDER_SITE_OTHER): Payer: Medicare Other

## 2021-01-27 DIAGNOSIS — J4540 Moderate persistent asthma, uncomplicated: Secondary | ICD-10-CM | POA: Diagnosis not present

## 2021-02-04 ENCOUNTER — Telehealth: Payer: Self-pay | Admitting: Family Medicine

## 2021-02-04 NOTE — Telephone Encounter (Signed)
Can you please put her on the schedule for an in person or televisit for tomorrow? Thank you

## 2021-02-04 NOTE — Telephone Encounter (Signed)
Pt is scheduled for 11 am tomorrow with c dale fnp

## 2021-02-04 NOTE — Telephone Encounter (Signed)
Patient is stating she has burning in her chest when this use to occur Dr. Beaulah Dinning always sent out an antibiotic Patient is asking for an RX of antibiotics pleas advise

## 2021-02-04 NOTE — Telephone Encounter (Signed)
Pt feels like it is beginning stages of a respiratory virus. No fever  doing all meds She said Dr B would always send in something for her before it settles in her chest. Covid test negative today

## 2021-02-04 NOTE — Patient Instructions (Addendum)
Asthma with acute exacerbation Continue Advair Diskus 500-50 -1 puff every 12 hours to prevent coughing or wheezing- name brand only, she failed generic Continue montelukast 10 mg once a day to prevent cough or wheeze Continue Spiriva Respimat 1.25 mcg  -2 puffs every morning to prevent cough and wheeze Use albuterol 2 puffs every 4 hours if needed for wheezing or coughing or instead albuterol 0.083% 1 unit dose every 4 hours if needed Continue Xolair injections 150 mg once every 4 weeks and have access to an epinephrine device Start prednisone 10 mg taking 1 tablet twice a day for 4 days, then on the fifth day take 1 tablet and stop.  While you are on prednisone make sure to monitor your blood sugar closely. Get STAT PA lateral chest x-ray due to burning sensation/shortness of breath.  We will call you with results once they are back Start Augmentin 875 mg taking 1 tablet twice a day for 7 days.  Allergic rhinitis Continue Allegra 60 mg- take one tablet once a day as needed for runny nose Continue Nasonex 1 spray per nostril twice a day if needed for stuffy nose Consider nasal saline rinses as needed for nasal symptoms Continue allergen avoidance measures  Allergic conjunctivitis Continue Optivar eye drops one drop in each eye twice a day as needed for red, itchy eyes.  Reflux Omeprazole 40 mg-take 1 capsule once a day for reflux Continue lifestyle modifications as listed below  Food allergy Continue to avoid shellfish.  If you have an allergic reaction give Benadryl 50 mg every 4 hours and if you has life-threatening symptoms inject with EpiPen 0.3 mg  Hives (urticaria)  Allegra 60 mg twice a day and famotidine (Pepcid) 20 mg twice a day. If no symptoms for 7-14 days then decrease to. Allegra 60 mg twice a day and famotidine (Pepcid) 20 mg once a day.  If no symptoms for 7-14 days then decrease to. Allegra 60 mg  twice a day.  If no symptoms for 7-14 days then decrease to. Allegra 60  mg once a day.  May use Benadryl (diphenhydramine) as needed for breakthrough hives       If symptoms return, then step up dosage  Keep a detailed symptom journal including foods eaten, contact with allergens, medications taken, weather changes.  Continue Xolair injections and have access to an epinephrine auto-injector set  Diabetes Keep track of how often you are using prednisone as this can change your blood sugar control.   Call us if you are not doing well on this treatment plan    Follow up in the clinic in 4 weeks or sooner if needed

## 2021-02-05 ENCOUNTER — Telehealth: Payer: Self-pay

## 2021-02-05 ENCOUNTER — Other Ambulatory Visit: Payer: Self-pay

## 2021-02-05 ENCOUNTER — Encounter: Payer: Self-pay | Admitting: Family

## 2021-02-05 ENCOUNTER — Ambulatory Visit (HOSPITAL_BASED_OUTPATIENT_CLINIC_OR_DEPARTMENT_OTHER)
Admission: RE | Admit: 2021-02-05 | Discharge: 2021-02-05 | Disposition: A | Discharge status: Home / Self Care | Payer: Medicare Other | Source: Ambulatory Visit | Attending: Family | Admitting: Family

## 2021-02-05 ENCOUNTER — Ambulatory Visit: Payer: Medicare Other | Admitting: Family

## 2021-02-05 VITALS — BP 138/64 | HR 111 | Temp 97.7°F | Resp 17 | Ht 61.0 in | Wt 213.8 lb

## 2021-02-05 DIAGNOSIS — H1010 Acute atopic conjunctivitis, unspecified eye: Secondary | ICD-10-CM

## 2021-02-05 DIAGNOSIS — L508 Other urticaria: Secondary | ICD-10-CM

## 2021-02-05 DIAGNOSIS — R0602 Shortness of breath: Secondary | ICD-10-CM | POA: Diagnosis present

## 2021-02-05 DIAGNOSIS — E1169 Type 2 diabetes mellitus with other specified complication: Secondary | ICD-10-CM

## 2021-02-05 DIAGNOSIS — J4541 Moderate persistent asthma with (acute) exacerbation: Secondary | ICD-10-CM | POA: Diagnosis not present

## 2021-02-05 DIAGNOSIS — H1013 Acute atopic conjunctivitis, bilateral: Secondary | ICD-10-CM

## 2021-02-05 DIAGNOSIS — T7800XD Anaphylactic reaction due to unspecified food, subsequent encounter: Secondary | ICD-10-CM

## 2021-02-05 DIAGNOSIS — R0789 Other chest pain: Secondary | ICD-10-CM | POA: Diagnosis present

## 2021-02-05 DIAGNOSIS — J3089 Other allergic rhinitis: Secondary | ICD-10-CM | POA: Diagnosis not present

## 2021-02-05 DIAGNOSIS — K219 Gastro-esophageal reflux disease without esophagitis: Secondary | ICD-10-CM

## 2021-02-05 MED ORDER — FAMOTIDINE 20 MG PO TABS: ORAL_TABLET | ORAL | 1 refills | Status: DC

## 2021-02-05 MED ORDER — CETIRIZINE HCL 10 MG PO TABS: 10.0000 mg | ORAL_TABLET | Freq: Two times a day (BID) | ORAL | 1 refills | Status: AC | PRN

## 2021-02-05 MED ORDER — SPIRIVA RESPIMAT 1.25 MCG/ACT IN AERS: 2.0000 | INHALATION_SPRAY | RESPIRATORY_TRACT | 1 refills | Status: DC

## 2021-02-05 MED ORDER — ALBUTEROL SULFATE (2.5 MG/3ML) 0.083% IN NEBU: 2.5000 mg | INHALATION_SOLUTION | RESPIRATORY_TRACT | 3 refills | Status: DC | PRN

## 2021-02-05 MED ORDER — FEXOFENADINE HCL 60 MG PO TABS: 60.0000 mg | ORAL_TABLET | Freq: Every day | ORAL | 1 refills | Status: AC | PRN

## 2021-02-05 MED ORDER — FLUTICASONE-SALMETEROL 500-50 MCG/ACT IN AEPB: 1.0000 | INHALATION_SPRAY | Freq: Two times a day (BID) | RESPIRATORY_TRACT | 1 refills | Status: DC

## 2021-02-05 MED ORDER — AZELASTINE HCL 0.05 % OP SOLN
1.0000 [drp] | Freq: Two times a day (BID) | OPHTHALMIC | 1 refills | Status: DC | PRN
Start: 2021-02-05 — End: 2022-01-18

## 2021-02-05 MED ORDER — MOMETASONE FUROATE 50 MCG/ACT NA SUSP: 1.0000 | Freq: Two times a day (BID) | NASAL | 1 refills | Status: DC | PRN

## 2021-02-05 MED ORDER — OMEPRAZOLE 40 MG PO CPDR
DELAYED_RELEASE_CAPSULE | ORAL | 3 refills | Status: DC
Start: 2021-02-05 — End: 2022-01-18

## 2021-02-05 MED ORDER — ALBUTEROL SULFATE HFA 108 (90 BASE) MCG/ACT IN AERS: 2.0000 | INHALATION_SPRAY | RESPIRATORY_TRACT | 1 refills | Status: DC | PRN

## 2021-02-05 MED ORDER — MONTELUKAST SODIUM 10 MG PO TABS: 10.0000 mg | ORAL_TABLET | Freq: Every day | ORAL | 3 refills | Status: DC

## 2021-02-05 MED ORDER — EPINEPHRINE 0.3 MG/0.3ML IJ SOAJ: INTRAMUSCULAR | 1 refills | Status: DC

## 2021-02-05 MED ORDER — AMOXICILLIN-POT CLAVULANATE 875-125 MG PO TABS: 1.0000 | ORAL_TABLET | Freq: Two times a day (BID) | ORAL | 0 refills | Status: AC

## 2021-02-05 NOTE — Telephone Encounter (Signed)
Patient called and expressed that she was unable to find [facility] to get her chest X-Ray completed. Patient stated that she went all the way up and down [place] and failed to find the hospital. Patient expressed at this time she is refusing to complete the X-Ray due to not finding the hospital.

## 2021-02-05 NOTE — Progress Notes (Signed)
Please let Jennifer Pruitt know that her chest x-ray did not show any cardiopulmonary process. It does show lucency overlying the left anterior heart, most likely a hiatal hernia. Please send a copy of her chest x-ray to her primary care physician.  Thank you, Nehemiah Settle, FNP

## 2021-02-05 NOTE — Telephone Encounter (Signed)
Noted. Please tell her to go to the emergency room if she is not getting any better.

## 2021-02-05 NOTE — Progress Notes (Signed)
[address] [place] [address] Dept: [phone]  FOLLOW UP NOTE  Patient ID: Jennifer Pruitt, female    DOB: 1951-01-22  Age: 70 y.o. MRN: [id number] Date of Office Visit: 02/05/2021  Assessment  Chief Complaint: Asthma (Says it is not good today. Says she has burin in her chest. Says she knows she is about to get sick and wants an antibiotic. Says the burning started Tuesday morning. )  HPI Jennifer Pruitt is a 70 year old female who presents today for an acute visit.  She was last seen on October 28, 2020 by Thermon Leyland, FNP.  She was last seen for moderate persistent asthma with acute exacerbation, allergic rhinitis, seasonal allergic conjunctivitis, chronic urticaria, gastroesophageal reflux disease, type 2 diabetes mellitus, and anaphylactic shock due to food.  Moderate persistent asthma is reported as not well controlled with Advair discus 500/50-1 puff twice a day, montelukast 10 mg once a day, Spiriva Respimat 1.25 mcg 2 puffs once a day, Xolair injections 150 mg every 4 weeks, and albuterol as needed.  She reports that she woke up on Monday or Tuesday of this week with her chest burning.  She mentions that Dr. Beaulah Dinning in the past said that if she gets like this to get an antibiotic and a steroid.  There was 1 time she was late and had to go to the emergency room.  She also mentions that she is wheezing more than normal, more short of breath than normal, and has had some nocturnal awakenings due to breathing problems.  She denies fever, chills, sick contacts, coughing, and tightness in her chest.  Since her last office visit she has not required any systemic steroids or made any trips to the emergency room or urgent care due to breathing problems.  Allergic rhinitis is reported as moderately controlled with Allegra 60 mg in the morning, Nasonex nasal spray as needed, and saline rinse as needed.  She reports that her throat that is dry and tickly and a little bit of nasal  congestion.  She denies sore throat, rhinorrhea, postnasal drip, and sinus tenderness.  Has not had any sinus infections since we last saw her.  Allergic conjunctivitis is reported as moderately controlled with Optivar eyedrops.  She reports itchy watery eyes for which Optivar helps.  Reflux is reported as moderately controlled with omeprazole 40 mg in the morning and sometimes famotidine at night.  She reports occasional heartburn depending on the food that she eats.  She continues to avoid shellfish without any accidental ingestion or use of her EpiPen.  She reports that she just picked up a refill of her EpiPen.  Hives are reported as controlled.  She reports that she has not had any hives since her last appointment.  She did mention that a couple weeks ago she did get itchy and tried to call our office but did not get a response.  She took Benadryl and it helped with the itching.  She is no longer having any itching.   Drug Allergies:  Allergies  Allergen Reactions   Tapazole [Methimazole] Swelling and Rash    HIVES, caused lips to swell   Iodine Hives and Swelling   Shellfish Allergy    Wixela Inhub [Fluticasone-Salmeterol]     Failed trial of    Review of Systems: Review of Systems  Constitutional:  Negative for chills and fever.  HENT:         Reports a dry throat that is tickly and a  little bit of nasal congestion.  Denies rhinorrhea and postnasal drip  Eyes:        Reports occasional itchy eyes for which Optivar helps  Respiratory:  Positive for shortness of breath and wheezing. Negative for cough.        Reports a burning in her chest since Monday or Tuesday, wheezing more than normal, shortness of breath more than normal, and nocturnal awakenings due to breathing problems.  Denies cough and tightness in her chest.  Cardiovascular:  Negative for chest pain and palpitations.  Gastrointestinal:  Positive for heartburn.       Reports heartburn every now and then depending on the  food she eats  Genitourinary:  Negative for frequency.  Skin:  Negative for rash.       Reports itching couple weeks ago but no hives but denies itching now  Neurological:  Negative for headaches.  Endo/Heme/Allergies:  Positive for environmental allergies.    Physical Exam: BP 138/64   Pulse (!) 111   Temp 97.7 F (36.5 C) (Temporal)   Resp 17   Ht 5\' 1"  (1.549 m)   Wt 213 lb 12.8 oz (97 kg)   SpO2 95%   BMI 40.40 kg/m    Physical Exam Constitutional:      Appearance: Normal appearance.  HENT:     Head: Normocephalic and atraumatic.     Comments: Pharynx normal, eyes normal, ears normal, nose: Bilateral lower turbinates mildly edematous and slightly erythematous with no drainage noted    Right Ear: Tympanic membrane, ear canal and external ear normal.     Left Ear: Tympanic membrane, ear canal and external ear normal.     Mouth/Throat:     Mouth: Mucous membranes are moist.     Pharynx: Oropharynx is clear.  Eyes:     Conjunctiva/sclera: Conjunctivae normal.  Cardiovascular:     Rate and Rhythm: Regular rhythm.     Heart sounds: Normal heart sounds.  Pulmonary:     Effort: Pulmonary effort is normal.     Comments: Lungs decreased throughout no wheezing, rhonchi, crackles or rails noted Musculoskeletal:     Cervical back: Neck supple.  Skin:    General: Skin is warm.  Neurological:     Mental Status: She is alert and oriented to person, place, and time.  Psychiatric:        Mood and Affect: Mood normal.        Behavior: Behavior normal.        Thought Content: Thought content normal.        Judgment: Judgment normal.    Diagnostics: FVC 0.83 L, FEV1 0.51 L (30%).  Predicted FVC 2.21 L, predicted FEV1 1.72 L.  Spirometry indicates possible very severe obstruction and restriction.  Assessment and Plan: 1. Burning in the chest   2. Moderate persistent asthma with acute exacerbation   3. Shortness of breath   4. Other allergic rhinitis   5. Seasonal allergic  conjunctivitis   6. Chronic urticaria   7. Gastroesophageal reflux disease without esophagitis   8. Anaphylactic shock due to food, subsequent encounter   9. Type 2 diabetes mellitus with other specified complication, without long-term current use of insulin (HCC)     Meds ordered this encounter  Medications   Tiotropium Bromide Monohydrate (SPIRIVA RESPIMAT) 1.25 MCG/ACT AERS    Sig: Inhale 2 puffs into the lungs daily.    Dispense:  12 g    Refill:  1   omeprazole (PRILOSEC) 40 MG  capsule    Sig: TAKE 1 CAPSULE BY MOUTH ONCE DAILY FOR ACID REFLUX    Dispense:  90 capsule    Refill:  3   montelukast (SINGULAIR) 10 MG tablet    Sig: Take 1 tablet (10 mg total) by mouth at bedtime.    Dispense:  90 tablet    Refill:  3    Dispense 90 days supply.   mometasone (NASONEX) 50 MCG/ACT nasal spray    Sig: Place 1 spray into the nose 2 (two) times daily as needed.    Dispense:  51 g    Refill:  1    90 day supply   fluticasone-salmeterol (ADVAIR) 500-50 MCG/ACT AEPB    Sig: Inhale 1 puff into the lungs in the morning and at bedtime.    Dispense:  180 each    Refill:  1   fexofenadine (ALLEGRA) 60 MG tablet    Sig: Take 1 tablet (60 mg total) by mouth daily as needed for allergies or rhinitis.    Dispense:  90 tablet    Refill:  1    Patient needs a 90 day supply   famotidine (PEPCID) 20 MG tablet    Sig: One tablet twice a day.    Dispense:  180 tablet    Refill:  1    90 day supply   EPINEPHrine (EPIPEN 2-PAK) 0.3 mg/0.3 mL IJ SOAJ injection    Sig: Use as directed for severe allergic reactions    Dispense:  4 each    Refill:  1   cetirizine (ZYRTEC ALLERGY) 10 MG tablet    Sig: Take 1 tablet (10 mg total) by mouth 2 (two) times daily as needed for allergies (Can use an extra dose during flare ups).    Dispense:  180 tablet    Refill:  1    Dispense 90 day supply   azelastine (OPTIVAR) 0.05 % ophthalmic solution    Sig: Place 1 drop into both eyes 2 (two) times daily as  needed.    Dispense:  18 mL    Refill:  1    90 day supply   albuterol (PROVENTIL) (2.5 MG/3ML) 0.083% nebulizer solution    Sig: Take 3 mLs (2.5 mg total) by nebulization every 4 (four) hours as needed for wheezing or shortness of breath.    Dispense:  150 mL    Refill:  3   albuterol (PROAIR HFA) 108 (90 Base) MCG/ACT inhaler    Sig: Inhale 2 puffs into the lungs every 4 (four) hours as needed for wheezing or shortness of breath.    Dispense:  54 g    Refill:  1    90 days   amoxicillin-clavulanate (AUGMENTIN) 875-125 MG tablet    Sig: Take 1 tablet by mouth 2 (two) times daily for 7 days.    Dispense:  14 tablet    Refill:  0    Patient Instructions  Asthma with acute exacerbation Continue Advair Diskus 500-50 -1 puff every 12 hours to prevent coughing or wheezing- name brand only, she failed generic Continue montelukast 10 mg once a day to prevent cough or wheeze Continue Spiriva Respimat 1.25 mcg  -2 puffs every morning to prevent cough and wheeze Use albuterol 2 puffs every 4 hours if needed for wheezing or coughing or instead albuterol 0.083% 1 unit dose every 4 hours if needed Continue Xolair injections 150 mg once every 4 weeks and have access to an epinephrine device Start prednisone 10 mg  taking 1 tablet twice a day for 4 days, then on the fifth day take 1 tablet and stop.  While you are on prednisone make sure to monitor your blood sugar closely. Get STAT PA lateral chest x-ray due to burning sensation/shortness of breath.  We will call you with results once they are back Start Augmentin 875 mg taking 1 tablet twice a day for 7 days.  Allergic rhinitis Continue Allegra 60 mg- take one tablet once a day as needed for runny nose Continue Nasonex 1 spray per nostril twice a day if needed for stuffy nose Consider nasal saline rinses as needed for nasal symptoms Continue allergen avoidance measures  Allergic conjunctivitis Continue Optivar eye drops one drop in each eye  twice a day as needed for red, itchy eyes.  Reflux Omeprazole 40 mg-take 1 capsule once a day for reflux Continue lifestyle modifications as listed below  Food allergy Continue to avoid shellfish.  If you have an allergic reaction give Benadryl 50 mg every 4 hours and if you has life-threatening symptoms inject with EpiPen 0.3 mg  Hives (urticaria)  Allegra 60 mg twice a day and famotidine (Pepcid) 20 mg twice a day. If no symptoms for 7-14 days then decrease to. Allegra 60 mg twice a day and famotidine (Pepcid) 20 mg once a day.  If no symptoms for 7-14 days then decrease to. Allegra 60 mg  twice a day.  If no symptoms for 7-14 days then decrease to. Allegra 60 mg once a day.  May use Benadryl (diphenhydramine) as needed for breakthrough hives       If symptoms return, then step up dosage  Keep a detailed symptom journal including foods eaten, contact with allergens, medications taken, weather changes.  Continue Xolair injections and have access to an epinephrine auto-injector set  Diabetes Keep track of how often you are using prednisone as this can change your blood sugar control.   Call us if you are not doing well on this treatment plan    Follow up in the clinic in 4 weeks or sooner if needed   Return in about 4 weeks (around 03/05/2021), or if symptoms worsen or fail to improve.    Thank you for the opportunity to care for this patient.  Please do not hesitate to contact me with questions.  Nehemiah Settle, FNP [facility]

## 2021-02-24 ENCOUNTER — Other Ambulatory Visit: Payer: Self-pay

## 2021-02-24 ENCOUNTER — Ambulatory Visit (INDEPENDENT_AMBULATORY_CARE_PROVIDER_SITE_OTHER): Payer: Medicare Other

## 2021-02-24 DIAGNOSIS — J4540 Moderate persistent asthma, uncomplicated: Secondary | ICD-10-CM | POA: Diagnosis not present

## 2021-02-24 DIAGNOSIS — J4541 Moderate persistent asthma with (acute) exacerbation: Secondary | ICD-10-CM

## 2021-03-05 ENCOUNTER — Telehealth: Payer: Self-pay | Admitting: Family

## 2021-03-05 MED ORDER — SPIRIVA RESPIMAT 1.25 MCG/ACT IN AERS: 2.0000 | INHALATION_SPRAY | RESPIRATORY_TRACT | 1 refills | Status: DC

## 2021-03-05 NOTE — Telephone Encounter (Signed)
Spiriva 1.25 mcg sent to [facility] in [place] x 90 day supply with 1 refill.

## 2021-03-05 NOTE — Telephone Encounter (Signed)
Patient is stating [facility] does not have the Medication for RX Tiotropium Bromide Monohydrate (SPIRIVA RESPIMAT) 1.25 MCG/ACT AERS [[id number]]  asking to move this RX to [facility] on [address] in [place]  please advise

## 2021-03-24 ENCOUNTER — Other Ambulatory Visit: Payer: Self-pay

## 2021-03-24 ENCOUNTER — Ambulatory Visit (INDEPENDENT_AMBULATORY_CARE_PROVIDER_SITE_OTHER): Payer: Medicare Other

## 2021-03-24 DIAGNOSIS — J4541 Moderate persistent asthma with (acute) exacerbation: Secondary | ICD-10-CM | POA: Diagnosis not present

## 2021-04-21 ENCOUNTER — Other Ambulatory Visit: Payer: Self-pay

## 2021-04-21 ENCOUNTER — Ambulatory Visit (INDEPENDENT_AMBULATORY_CARE_PROVIDER_SITE_OTHER): Payer: Medicare Other

## 2021-04-21 DIAGNOSIS — J4540 Moderate persistent asthma, uncomplicated: Secondary | ICD-10-CM | POA: Diagnosis not present

## 2021-04-21 DIAGNOSIS — J4541 Moderate persistent asthma with (acute) exacerbation: Secondary | ICD-10-CM

## 2021-05-19 ENCOUNTER — Ambulatory Visit (INDEPENDENT_AMBULATORY_CARE_PROVIDER_SITE_OTHER): Payer: Medicare Other

## 2021-05-19 ENCOUNTER — Other Ambulatory Visit: Payer: Self-pay

## 2021-05-19 DIAGNOSIS — J4541 Moderate persistent asthma with (acute) exacerbation: Secondary | ICD-10-CM

## 2021-05-26 ENCOUNTER — Ambulatory Visit: Payer: Medicare Other | Admitting: Family Medicine

## 2021-05-26 ENCOUNTER — Other Ambulatory Visit: Payer: Self-pay

## 2021-05-26 ENCOUNTER — Encounter: Payer: Self-pay | Admitting: Family Medicine

## 2021-05-26 VITALS — BP 122/72 | HR 87 | Temp 97.7°F | Resp 20

## 2021-05-26 DIAGNOSIS — J4550 Severe persistent asthma, uncomplicated: Secondary | ICD-10-CM | POA: Diagnosis not present

## 2021-05-26 DIAGNOSIS — J3089 Other allergic rhinitis: Secondary | ICD-10-CM

## 2021-05-26 DIAGNOSIS — L508 Other urticaria: Secondary | ICD-10-CM

## 2021-05-26 DIAGNOSIS — T7800XD Anaphylactic reaction due to unspecified food, subsequent encounter: Secondary | ICD-10-CM

## 2021-05-26 DIAGNOSIS — E1169 Type 2 diabetes mellitus with other specified complication: Secondary | ICD-10-CM

## 2021-05-26 DIAGNOSIS — H1013 Acute atopic conjunctivitis, bilateral: Secondary | ICD-10-CM | POA: Diagnosis not present

## 2021-05-26 DIAGNOSIS — K219 Gastro-esophageal reflux disease without esophagitis: Secondary | ICD-10-CM

## 2021-05-26 DIAGNOSIS — H1010 Acute atopic conjunctivitis, unspecified eye: Secondary | ICD-10-CM

## 2021-05-26 MED ORDER — MONTELUKAST SODIUM 10 MG PO TABS: 10.0000 mg | ORAL_TABLET | Freq: Every day | ORAL | 1 refills | Status: DC

## 2021-05-26 MED ORDER — SPIRIVA RESPIMAT 1.25 MCG/ACT IN AERS: 2.0000 | INHALATION_SPRAY | RESPIRATORY_TRACT | 1 refills | Status: DC

## 2021-05-26 MED ORDER — FLUTICASONE-SALMETEROL 500-50 MCG/ACT IN AEPB: 1.0000 | INHALATION_SPRAY | Freq: Two times a day (BID) | RESPIRATORY_TRACT | 1 refills | Status: DC

## 2021-05-26 NOTE — Patient Instructions (Addendum)
Asthma Continue Advair Diskus 500-50 -1 puff every 12 hours to prevent coughing or wheezing- Keep ising name brand only, she failed generic Continue montelukast 10 mg once a day to prevent cough or wheeze Continue Spiriva Respimat 1.25 mcg  -2 puffs every morning to prevent cough and wheeze Use albuterol 2 puffs every 4 hours if needed for wheezing or coughing or instead albuterol 0.083% 1 unit dose every 4 hours if needed Continue Xolair injections 150 mg once every 4 weeks and have access to an epinephrine device For asthma flare, only if needed, begin Prednisone 10 mg tablets. Take 2 tablets once a day for 4 days, then take 1 tablet on the 5th day, and call the clinic. Continue to check your blood sugar daily if you need to begin the prednisone taper.  Allergic rhinitis Continue Allegra 60 mg- take one tablet once a day as needed for runny nose Continue Nasonex 1 spray per nostril twice a day if needed for stuffy nose Consider nasal saline rinses as needed for nasal symptoms Continue allergen avoidance measures  Allergic conjunctivitis Continue Optivar eye drops one drop in each eye twice a day as needed for red, itchy eyes.  Reflux Omeprazole 40 mg-take 1 capsule once a day for reflux Continue lifestyle modifications as listed below  Food allergy Continue to avoid shellfish.  If you have an allergic reaction give Benadryl 50 mg every 4 hours and if you has life-threatening symptoms inject with EpiPen 0.3 mg  Hives (urticaria)  Allegra 60 mg twice a day and famotidine (Pepcid) 20 mg twice a day. If no symptoms for 7-14 days then decrease to Allegra 60 mg twice a day and famotidine (Pepcid) 20 mg once a day.  If no symptoms for 7-14 days then decrease to Allegra 60 mg  twice a day.  If no symptoms for 7-14 days then decrease to Allegra 60 mg once a day.  May use Benadryl (diphenhydramine) as needed for breakthrough hives       If symptoms return, then step up dosage  Keep a  detailed symptom journal including foods eaten, contact with allergens, medications taken, weather changes.  Continue Xolair injections and have access to an epinephrine auto-injector set  Diabetes Keep track of how often you are using prednisone as this can change your blood sugar control.   Call us if you are not doing well on this treatment plan  Continue on your other medications as listed in your chart  Follow up in the clinic in  6 months or sooner if needed

## 2021-05-26 NOTE — Progress Notes (Signed)
[address] [place] [address] Dept: [phone]  FOLLOW UP NOTE  Patient ID: Jennifer Pruitt, female    DOB: 01-25-1951  Age: 71 y.o. MRN: [id number] Date of Office Visit: 05/26/2021  Assessment  Chief Complaint: Asthma  HPI Jennifer Pruitt is a 71 year old female who presents to the clinic for follow-up visit.  She was last seen in this clinic on 10/28/2020 by Thermon Leyland, FNP, for evaluation of asthma with acute exacerbation requiring prednisone, allergic rhinitis, allergic conjunctivitis, reflux, food allergy to shellfish, and urticaria.  At today's visit, she reports her asthma has been " not too bad" since her last visit to this clinic.  She reports occasional shortness of breath which has not increased with activity, occasional wheeze occurring mainly when she goes outside in the pollen is high, and cough last week that was producing clear mucus and has resolved at this time.  She continues Advair 500-1 puff twice a day, montelukast 10 mg once a day, Spiriva 1.25-2 puffs once a day, and has used albuterol once a day for the last week while she had the cough.  She continues Xolair injections once every 4 weeks with no large or local reactions.  She reports a significant decrease in her symptoms of asthma while continuing Xolair injections.  Allergic rhinitis is reported as moderately well controlled with symptoms including nasal congestion and sneezing when she goes outside in the pollen is high.  She denies rhinorrhea or postnasal drainage.  She continues Allegra 60 mg once a day and is currently using saline nasal rinses as needed.  Allergic conjunctivitis is reported as moderately well controlled with red and itchy eyes occurring intermittently for which she continues Optivar with relief of symptoms.  She does report some recent soreness in her left eye which has improved with 1 dose of Optivar at this time.  Reflux is reported as well controlled with no symptoms including heartburn or  vomiting.  She continues omeprazole 40 mg once a day.  She reports that she has not had a hive breakout for about 1 year and has not taken famotidine in that time with no breakouts.  She continues to avoid shellfish with no accidental ingestion or EpiPen use since her last visit to this clinic.  Her current medications are listed in the chart.   Drug Allergies:  Allergies  Allergen Reactions   Tapazole [Methimazole] Swelling and Rash    HIVES, caused lips to swell   Iodine Hives and Swelling   Shellfish Allergy    Wixela Inhub [Fluticasone-Salmeterol]     Failed trial of    Physical Exam: BP 122/72    Pulse 87    Temp 97.7 F (36.5 C) (Temporal)    Resp 20    SpO2 96%    Physical Exam Vitals reviewed.  Constitutional:      Appearance: Normal appearance.  HENT:     Head: Normocephalic and atraumatic.     Right Ear: Tympanic membrane normal.     Left Ear: Tympanic membrane normal.     Nose:     Comments: Bilateral nares slightly erythematous with clear nasal drainage noted.  Pharynx is slightly erythematous with no exudate.  Ears normal.  Eyes normal.    Mouth/Throat:     Pharynx: Oropharynx is clear.  Eyes:     Conjunctiva/sclera: Conjunctivae normal.  Cardiovascular:     Rate and Rhythm: Normal rate and regular rhythm.     Heart sounds: Normal heart sounds.  No murmur heard. Pulmonary:     Effort: Pulmonary effort is normal.     Breath sounds: Normal breath sounds.     Comments: Lungs clear to auscultation Musculoskeletal:        General: Normal range of motion.     Cervical back: Normal range of motion and neck supple.  Skin:    General: Skin is warm and dry.  Neurological:     Mental Status: She is alert and oriented to person, place, and time.  Psychiatric:        Mood and Affect: Mood normal.        Behavior: Behavior normal.        Thought Content: Thought content normal.        Judgment: Judgment normal.    Diagnostics: FVC 0.76, FEV1 0.59.  Predicted FVC  2.20, predicted FEV1 1.72.  Spirometry indicates possible obstruction and restriction.  This is consistent with previous spirometry readings.  Assessment and Plan: 1. Severe persistent asthma without complication   2. Seasonal allergic conjunctivitis   3. Chronic urticaria   4. Type 2 diabetes mellitus with other specified complication, without long-term current use of insulin (HCC)   5. Anaphylactic shock due to food, subsequent encounter   6. Gastroesophageal reflux disease without esophagitis   7. Other allergic rhinitis     Meds ordered this encounter  Medications   montelukast (SINGULAIR) 10 MG tablet    Sig: Take 1 tablet (10 mg total) by mouth at bedtime.    Dispense:  90 tablet    Refill:  1    Dispense 90 days supply.   Tiotropium Bromide Monohydrate (SPIRIVA RESPIMAT) 1.25 MCG/ACT AERS    Sig: Inhale 2 puffs into the lungs daily.    Dispense:  12 g    Refill:  1   fluticasone-salmeterol (ADVAIR) 500-50 MCG/ACT AEPB    Sig: Inhale 1 puff into the lungs in the morning and at bedtime.    Dispense:  180 each    Refill:  1    Patient Instructions  Asthma Continue Advair Diskus 500-50 -1 puff every 12 hours to prevent coughing or wheezing- Keep ising name brand only, she failed generic Continue montelukast 10 mg once a day to prevent cough or wheeze Continue Spiriva Respimat 1.25 mcg  -2 puffs every morning to prevent cough and wheeze Use albuterol 2 puffs every 4 hours if needed for wheezing or coughing or instead albuterol 0.083% 1 unit dose every 4 hours if needed Continue Xolair injections 150 mg once every 4 weeks and have access to an epinephrine device For asthma flare, only if needed, begin Prednisone 10 mg tablets. Take 2 tablets once a day for 4 days, then take 1 tablet on the 5th day, and call the clinic. Continue to check your blood sugar daily if you need to begin the prednisone taper.  Allergic rhinitis Continue Allegra 60 mg- take one tablet once a day as  needed for runny nose Continue Nasonex 1 spray per nostril twice a day if needed for stuffy nose Consider nasal saline rinses as needed for nasal symptoms Continue allergen avoidance measures  Allergic conjunctivitis Continue Optivar eye drops one drop in each eye twice a day as needed for red, itchy eyes.  Reflux Omeprazole 40 mg-take 1 capsule once a day for reflux Continue lifestyle modifications as listed below  Food allergy Continue to avoid shellfish.  If you have an allergic reaction give Benadryl 50 mg every 4 hours and if you  has life-threatening symptoms inject with EpiPen 0.3 mg  Hives (urticaria)  Allegra 60 mg twice a day and famotidine (Pepcid) 20 mg twice a day. If no symptoms for 7-14 days then decrease to Allegra 60 mg twice a day and famotidine (Pepcid) 20 mg once a day.  If no symptoms for 7-14 days then decrease to Allegra 60 mg  twice a day.  If no symptoms for 7-14 days then decrease to Allegra 60 mg once a day.  May use Benadryl (diphenhydramine) as needed for breakthrough hives       If symptoms return, then step up dosage  Keep a detailed symptom journal including foods eaten, contact with allergens, medications taken, weather changes.  Continue Xolair injections and have access to an epinephrine auto-injector set  Diabetes Keep track of how often you are using prednisone as this can change your blood sugar control.   Call us if you are not doing well on this treatment plan  Continue on your other medications as listed in your chart  Follow up in the clinic in  6 months or sooner if needed   Return in about 6 months (around 11/23/2021), or if symptoms worsen or fail to improve.    Thank you for the opportunity to care for this patient.  Please do not hesitate to contact me with questions.  Thermon Leyland, FNP [facility]

## 2021-06-16 ENCOUNTER — Other Ambulatory Visit: Payer: Self-pay

## 2021-06-16 ENCOUNTER — Ambulatory Visit (INDEPENDENT_AMBULATORY_CARE_PROVIDER_SITE_OTHER): Payer: Medicare Other

## 2021-06-16 DIAGNOSIS — J4541 Moderate persistent asthma with (acute) exacerbation: Secondary | ICD-10-CM

## 2021-07-14 ENCOUNTER — Ambulatory Visit (INDEPENDENT_AMBULATORY_CARE_PROVIDER_SITE_OTHER): Payer: Medicare Other

## 2021-07-14 DIAGNOSIS — J4540 Moderate persistent asthma, uncomplicated: Secondary | ICD-10-CM | POA: Diagnosis not present

## 2021-07-14 DIAGNOSIS — J4541 Moderate persistent asthma with (acute) exacerbation: Secondary | ICD-10-CM

## 2021-08-11 ENCOUNTER — Ambulatory Visit (INDEPENDENT_AMBULATORY_CARE_PROVIDER_SITE_OTHER): Payer: Medicare Other

## 2021-08-11 DIAGNOSIS — J4540 Moderate persistent asthma, uncomplicated: Secondary | ICD-10-CM | POA: Diagnosis not present

## 2021-08-31 ENCOUNTER — Telehealth: Payer: Self-pay | Admitting: Family Medicine

## 2021-08-31 NOTE — Telephone Encounter (Signed)
Pt states she brought paperwork that needed to be filled out a month ago. Thurston Hole states the paperwork was filled out and put in the nurses station. Pt would like her paper work  ASAP.

## 2021-08-31 NOTE — Telephone Encounter (Signed)
Informed pt that pauline mailed out the paperwork 2 weeks ago but pt will bring another form by next week when she comes for shot for [place] to fill out as facility never received it

## 2021-09-08 ENCOUNTER — Ambulatory Visit (INDEPENDENT_AMBULATORY_CARE_PROVIDER_SITE_OTHER): Payer: Medicare Other

## 2021-09-08 DIAGNOSIS — J4540 Moderate persistent asthma, uncomplicated: Secondary | ICD-10-CM | POA: Diagnosis not present

## 2021-10-06 ENCOUNTER — Ambulatory Visit (INDEPENDENT_AMBULATORY_CARE_PROVIDER_SITE_OTHER): Payer: Medicare Other

## 2021-10-06 DIAGNOSIS — J4540 Moderate persistent asthma, uncomplicated: Secondary | ICD-10-CM

## 2021-10-12 ENCOUNTER — Other Ambulatory Visit: Payer: Self-pay

## 2021-10-12 ENCOUNTER — Other Ambulatory Visit: Payer: Self-pay | Admitting: Family Medicine

## 2021-10-12 MED ORDER — SPIRIVA RESPIMAT 1.25 MCG/ACT IN AERS: 2.0000 | INHALATION_SPRAY | RESPIRATORY_TRACT | 0 refills | Status: DC

## 2021-11-03 ENCOUNTER — Ambulatory Visit (INDEPENDENT_AMBULATORY_CARE_PROVIDER_SITE_OTHER): Payer: Medicare Other

## 2021-11-03 DIAGNOSIS — J4540 Moderate persistent asthma, uncomplicated: Secondary | ICD-10-CM | POA: Diagnosis not present

## 2021-12-01 ENCOUNTER — Ambulatory Visit (INDEPENDENT_AMBULATORY_CARE_PROVIDER_SITE_OTHER): Payer: Medicare Other | Admitting: *Deleted

## 2021-12-01 ENCOUNTER — Ambulatory Visit: Payer: Medicare Other

## 2021-12-01 DIAGNOSIS — J4540 Moderate persistent asthma, uncomplicated: Secondary | ICD-10-CM

## 2021-12-29 ENCOUNTER — Ambulatory Visit (INDEPENDENT_AMBULATORY_CARE_PROVIDER_SITE_OTHER): Payer: Medicare Other

## 2021-12-29 DIAGNOSIS — J4540 Moderate persistent asthma, uncomplicated: Secondary | ICD-10-CM

## 2022-01-15 ENCOUNTER — Telehealth: Payer: Self-pay | Admitting: Family

## 2022-01-15 ENCOUNTER — Ambulatory Visit: Payer: Medicare Other | Admitting: Internal Medicine

## 2022-01-15 ENCOUNTER — Other Ambulatory Visit: Payer: Self-pay

## 2022-01-15 MED ORDER — SPIRIVA RESPIMAT 1.25 MCG/ACT IN AERS: 2.0000 | INHALATION_SPRAY | RESPIRATORY_TRACT | 0 refills | Status: DC

## 2022-01-15 NOTE — Telephone Encounter (Signed)
Patient asking for an RX for Tiotropium Bromide Monohydrate (SPIRIVA RESPIMAT) 1.25 MCG/ACT AERS [[id number]] advised patient she has not been seen in almost a year made an appointment for 01-18-2022 patient asking for a cursty refill please advise

## 2022-01-15 NOTE — Telephone Encounter (Signed)
Sent in spiriva respimat 1.25 mcg one time only as a courtesy refill. Patient has an appointment with Dr. Simona Huh 01/15/2022. I also just reminded the patient bc of her having asthma we need to see her every 6 months. Patient wasn't aware of that.

## 2022-01-15 NOTE — Telephone Encounter (Signed)
Sent in spiriva respimat 1.25 mcg one time only as a courtesy refill only. Patient has a scheduled appointment 01/15/2022 with Dr. Simona Huh. Above prescription sent into [facility] [place].

## 2022-01-17 NOTE — Progress Notes (Unsigned)
FOLLOW UP Date of Service/Encounter:  01/18/22   Subjective:  Jennifer Pruitt (DOB: 14-Apr-1950) is a 71 y.o. female who [facility] in re-evaluation of the following: asthma with acute exacerbation requiring prednisone, allergic rhinitis, allergic conjunctivitis, reflux, food allergy to shellfish, and urticaria History obtained from: chart review and patient.  For Review, LV was on 05/26/2021 with Thermon Leyland, FNP seen for routine follow-up.  She reported improvement of asthma while on Xolair. Had not had hives in over a year.  Her FEV1 was 0.59 L showing obstruction and restriction, consistent with previous spirometry.  Last Xolair injection 12/29/2021, next due 01/26/2022  Today presents for follow-up. She reports today that her asthma is stable.  She uses her rescue inhaler or nebulizer during URI illnesses.  She has not needed it at all during the last month.  She likes to keep prednisone on hand to keep herself out of the hospital when she has a cold.  She has used the prescription that was given to her at the last visit during a cold.  Outside of that she feels like she is doing well, and feels that her asthma is the best that it has been since starting Xolair.  She is no longer dealing with hives because of the Xolair.  She would like to continue on this medication. She has had no accidental exposures to shellfish since last visit. She continues to take omeprazole for her reflux and feels that it is controlled. She continues with her Advair discus 500, 1 puff twice a day.  Montelukast, Spiriva, Allegra and Nasonex as well as Optivar.  She would like refills today. When hives have been active in the past she will take famotidine and would also like a refill of this.  Allergies as of 01/18/2022       Reactions   Tapazole [methimazole] Swelling, Rash   HIVES, caused lips to swell   Iodine Hives, Swelling   Shellfish Allergy    Wixela Inhub  [fluticasone-salmeterol]    Failed trial of        Medication List        Accurate as of January 18, 2022 12:12 PM. If you have any questions, ask your nurse or doctor.          albuterol 108 (90 Base) MCG/ACT inhaler Commonly known as: ProAir HFA Inhale 2 puffs into the lungs every 4 (four) hours as needed for wheezing or shortness of breath.   albuterol (2.5 MG/3ML) 0.083% nebulizer solution Commonly known as: PROVENTIL Take 3 mLs (2.5 mg total) by nebulization every 4 (four) hours as needed for wheezing or shortness of breath.   azelastine 0.05 % ophthalmic solution Commonly known as: OPTIVAR Place 1 drop into both eyes 2 (two) times daily as needed.   cetirizine 10 MG tablet Commonly known as: ZyrTEC Allergy Take 1 tablet (10 mg total) by mouth 2 (two) times daily as needed for allergies (Can use an extra dose during flare ups).   Combivent Respimat 20-100 MCG/ACT Aers respimat Generic drug: Ipratropium-Albuterol Inhale 1 puff into the lungs 4 (four) times daily.   EPINEPHrine 0.3 mg/0.3 mL Soaj injection Commonly known as: EpiPen 2-Pak Use as directed for severe allergic reactions   famotidine 20 MG tablet Commonly known as: PEPCID One tablet twice a day.   fexofenadine 60 MG tablet Commonly known as: ALLEGRA Take 1 tablet (60 mg total) by mouth daily as needed for allergies or rhinitis.  fluticasone-salmeterol 500-50 MCG/ACT Aepb Commonly known as: ADVAIR Inhale 1 puff into the lungs in the morning and at bedtime.   furosemide 40 MG tablet Commonly known as: LASIX 40 mg.   glimepiride 4 MG tablet Commonly known as: AMARYL 4 mg.   Hematinic Plus Vit/Minerals 106-1 MG Tabs Take by mouth.   IRON PO Take by mouth.   levothyroxine 125 MCG tablet Commonly known as: SYNTHROID TAKE 1 TABLET BY MOUTH DAILY   metFORMIN 500 MG tablet Commonly known as: GLUCOPHAGE Take 500 mg by mouth.   mometasone 50 MCG/ACT nasal spray Commonly known as:  NASONEX Place 1 spray into the nose 2 (two) times daily as needed.   montelukast 10 MG tablet Commonly known as: SINGULAIR Take 1 tablet (10 mg total) by mouth at bedtime.   omeprazole 40 MG capsule Commonly known as: PRILOSEC TAKE 1 CAPSULE BY MOUTH ONCE DAILY FOR ACID REFLUX   Spiriva Respimat 1.25 MCG/ACT Aers Generic drug: Tiotropium Bromide Monohydrate Inhale 2 puffs into the lungs daily.   Xolair 150 MG injection Generic drug: omalizumab INJECT 150MG  SUBCUTANEOUSLY EVERY 4 WEEKS       Past Medical History:  Diagnosis Date   Allergic rhinitis    Asthma    Hyperthyroidism    Shellfish allergy    No past surgical history on file. Otherwise, there have been no changes to her past medical history, surgical history, family history, or social history.  ROS: All others negative except as noted per HPI.   Objective:  BP (!) 146/70 (BP Location: Left Arm, Patient Position: Sitting, Cuff Size: Normal)   Pulse 99   Temp 97.9 F (36.6 C) (Temporal)   Resp 18   Ht 5\' 1"  (1.549 m)   Wt 211 lb 4.8 oz (95.8 kg)   SpO2 95%   BMI 39.92 kg/m  Body mass index is 39.92 kg/m. Physical Exam: General Appearance:  Alert, cooperative, no distress, appears stated age  Head:  Normocephalic, without obvious abnormality, atraumatic  Eyes:  Conjunctiva clear, EOM's intact  Nose: Nares normal, hypertrophic turbinates, normal mucosa, no visible anterior polyps, and septum midline  Throat: Lips, tongue normal; teeth and gums normal, normal posterior oropharynx  Neck: Supple, symmetrical  Lungs:   clear to auscultation bilaterally, Respirations unlabored, no coughing  Heart:  regular rate and rhythm and no murmur, Appears well perfused  Extremities: No edema  Skin: Skin color, texture, turgor normal, no rashes or lesions on visualized portions of skin  Neurologic: No gross deficits  Spirometry:  Tracings reviewed. Her effort: Good reproducible efforts. FVC: 1.08L FEV1: 0.58L, 35%  predicted FEV1/FVC ratio: 0.54 Interpretation: Spirometry consistent with severe obstructive disease and possible restriction Please see scanned spirometry results for details.   Assessment/Plan   Asthma-severe persistent, stable. Continue Advair Diskus 500-50 -1 puff every 12 hours to prevent coughing or wheezing- Keep ising name brand only, she failed generic Continue montelukast 10 mg once a day to prevent cough or wheeze Continue Spiriva Respimat 1.25 mcg  -2 puffs every morning to prevent cough and wheeze Use albuterol 2 puffs every 4 hours if needed for wheezing or coughing or instead albuterol 0.083% 1 unit dose every 4 hours if needed Continue Xolair injections 150 mg once every 4 weeks and have access to an epinephrine device. Discussed consideration of Tezspire if asthma control worsens.  This medication would not address hives. For asthma flare, only if needed, begin Prednisone 10 mg tablets. Take 2 tablets once a day for 4 days,  then take 1 tablet on the 5th day, and call the clinic. Continue to check your blood sugar daily if you need to begin the prednisone taper.  Allergic rhinitis-controlled Continue Allegra 60 mg- take one tablet once a day as needed for runny nose Continue Nasonex 1 spray per nostril twice a day if needed for stuffy nose Consider nasal saline rinses as needed for nasal symptoms Continue allergen avoidance measures  Allergic conjunctivitis-controlled Continue Optivar eye drops one drop in each eye twice a day as needed for red, itchy eyes.  Reflux-controlled Omeprazole 40 mg-take 1 capsule once a day for reflux Continue lifestyle modifications as listed below  Food allergy-controlled Continue to avoid shellfish.  If you have an allergic reaction give Benadryl 50 mg every 4 hours and if you has life-threatening symptoms inject with EpiPen 0.3 mg  Hives (urticaria)-controlled  Allegra 60 mg twice a day and famotidine (Pepcid) 20 mg twice a day. If no  symptoms for 7-14 days then decrease to. Allegra 60 mg twice a day and famotidine (Pepcid) 20 mg once a day.  If no symptoms for 7-14 days then decrease to. Allegra 60 mg  twice a day.  If no symptoms for 7-14 days then decrease to. Allegra 60 mg once a day.  May use Benadryl (diphenhydramine) as needed for breakthrough hives       If symptoms return, then step up dosage   Continue Xolair injections and have access to an epinephrine auto-injector set  Diabetes Keep track of how often you are using prednisone as this can change your blood sugar control.   Call us if you are not doing well on this treatment plan  Continue on your other medications as listed in your chart  Follow up in the clinic in 6 months or sooner if needed  Sigurd Sos, MD  [facility]

## 2022-01-18 ENCOUNTER — Encounter: Payer: Self-pay | Admitting: Internal Medicine

## 2022-01-18 ENCOUNTER — Ambulatory Visit: Payer: Medicare Other | Admitting: Internal Medicine

## 2022-01-18 DIAGNOSIS — J4550 Severe persistent asthma, uncomplicated: Secondary | ICD-10-CM

## 2022-01-18 MED ORDER — FLUTICASONE-SALMETEROL 500-50 MCG/ACT IN AEPB
1.0000 | INHALATION_SPRAY | Freq: Two times a day (BID) | RESPIRATORY_TRACT | 1 refills | Status: DC
Start: 2022-01-18 — End: 2022-07-29

## 2022-01-18 MED ORDER — OMEPRAZOLE 40 MG PO CPDR: DELAYED_RELEASE_CAPSULE | ORAL | 3 refills | Status: DC

## 2022-01-18 MED ORDER — ALBUTEROL SULFATE (2.5 MG/3ML) 0.083% IN NEBU
2.5000 mg | INHALATION_SOLUTION | RESPIRATORY_TRACT | 3 refills | Status: AC | PRN
Start: 2022-01-18 — End: ?

## 2022-01-18 MED ORDER — SPIRIVA RESPIMAT 1.25 MCG/ACT IN AERS: 2.0000 | INHALATION_SPRAY | RESPIRATORY_TRACT | 0 refills | Status: DC

## 2022-01-18 MED ORDER — ALBUTEROL SULFATE HFA 108 (90 BASE) MCG/ACT IN AERS: 2.0000 | INHALATION_SPRAY | RESPIRATORY_TRACT | 1 refills | Status: DC | PRN

## 2022-01-18 MED ORDER — EPINEPHRINE 0.3 MG/0.3ML IJ SOAJ: INTRAMUSCULAR | 1 refills | Status: DC

## 2022-01-18 MED ORDER — FAMOTIDINE 20 MG PO TABS: ORAL_TABLET | ORAL | 1 refills | Status: DC

## 2022-01-18 MED ORDER — MONTELUKAST SODIUM 10 MG PO TABS: 10.0000 mg | ORAL_TABLET | Freq: Every day | ORAL | 1 refills | Status: DC

## 2022-01-18 MED ORDER — AZELASTINE HCL 0.05 % OP SOLN: 1.0000 [drp] | Freq: Two times a day (BID) | OPHTHALMIC | 1 refills | Status: AC | PRN

## 2022-01-18 NOTE — Addendum Note (Signed)
Addended by: Isabel Caprice on: 01/18/2022 05:16 PM   Modules accepted: Orders

## 2022-01-18 NOTE — Patient Instructions (Addendum)
Asthma-severe persistent, stable. Continue Advair Diskus 500-50 -1 puff every 12 hours to prevent coughing or wheezing- Keep ising name brand only, she failed generic Continue montelukast 10 mg once a day to prevent cough or wheeze Continue Spiriva Respimat 1.25 mcg  -2 puffs every morning to prevent cough and wheeze Use albuterol 2 puffs every 4 hours if needed for wheezing or coughing or instead albuterol 0.083% 1 unit dose every 4 hours if needed Continue Xolair injections 150 mg once every 4 weeks and have access to an epinephrine device. Discussed consideration of Tezspire if asthma control worsens.  This medication would not address hives. For asthma flare, only if needed, begin Prednisone 10 mg tablets. Take 2 tablets once a day for 4 days, then take 1 tablet on the 5th day, and call the clinic. Continue to check your blood sugar daily if you need to begin the prednisone taper.  Allergic rhinitis-controlled Continue Allegra 60 mg- take one tablet once a day as needed for runny nose Continue Nasonex 1 spray per nostril twice a day if needed for stuffy nose Consider nasal saline rinses as needed for nasal symptoms Continue allergen avoidance measures  Allergic conjunctivitis-controlled Continue Optivar eye drops one drop in each eye twice a day as needed for red, itchy eyes.  Reflux-controlled Omeprazole 40 mg-take 1 capsule once a day for reflux Continue lifestyle modifications as listed below  Food allergy-controlled Continue to avoid shellfish.  If you have an allergic reaction give Benadryl 50 mg every 4 hours and if you has life-threatening symptoms inject with EpiPen 0.3 mg  Hives (urticaria)-controlled  Allegra 60 mg twice a day and famotidine (Pepcid) 20 mg twice a day. If no symptoms for 7-14 days then decrease to. Allegra 60 mg twice a day and famotidine (Pepcid) 20 mg once a day.  If no symptoms for 7-14 days then decrease to. Allegra 60 mg  twice a day.  If no symptoms  for 7-14 days then decrease to. Allegra 60 mg once a day.  May use Benadryl (diphenhydramine) as needed for breakthrough hives       If symptoms return, then step up dosage   Continue Xolair injections and have access to an epinephrine auto-injector set  Diabetes Keep track of how often you are using prednisone as this can change your blood sugar control.   Call us if you are not doing well on this treatment plan  Continue on your other medications as listed in your chart  Follow up in the clinic in 6 months or sooner if needed

## 2022-01-20 ENCOUNTER — Other Ambulatory Visit: Payer: Self-pay | Admitting: *Deleted

## 2022-01-20 ENCOUNTER — Other Ambulatory Visit (HOSPITAL_COMMUNITY): Payer: Self-pay

## 2022-01-20 NOTE — Telephone Encounter (Signed)
PA has been sent to [facility] for Spiriva Respimat 1.25 KEY: [id number]

## 2022-01-20 NOTE — Telephone Encounter (Signed)
Received a fax from [facility] regarding Prior Authorization for Spiriva Respimat 1.25MCG/ACT aerosol.   Authorization has been DENIED due to not meeting the quantity limit requirement. : 12 gram per 30 day  PA was submitted incorrectly. 12g is 90 day supply. Contacted pharmacy and was reran as 4g per 30 with successful fill.

## 2022-01-26 ENCOUNTER — Ambulatory Visit (INDEPENDENT_AMBULATORY_CARE_PROVIDER_SITE_OTHER): Payer: Medicare Other

## 2022-01-26 DIAGNOSIS — J4550 Severe persistent asthma, uncomplicated: Secondary | ICD-10-CM

## 2022-02-23 ENCOUNTER — Ambulatory Visit (INDEPENDENT_AMBULATORY_CARE_PROVIDER_SITE_OTHER): Payer: Medicare Other

## 2022-02-23 DIAGNOSIS — J4540 Moderate persistent asthma, uncomplicated: Secondary | ICD-10-CM | POA: Diagnosis not present

## 2022-03-19 ENCOUNTER — Other Ambulatory Visit: Payer: Self-pay | Admitting: Family

## 2022-03-25 ENCOUNTER — Ambulatory Visit (INDEPENDENT_AMBULATORY_CARE_PROVIDER_SITE_OTHER): Payer: Medicare Other

## 2022-03-25 DIAGNOSIS — J4540 Moderate persistent asthma, uncomplicated: Secondary | ICD-10-CM | POA: Diagnosis not present

## 2022-04-03 IMAGING — DX DG CHEST 2V
2 series · 2 of 2 positions shown · non-contrast
Comparison: No prior radiograph available for comparison.

CLINICAL DATA: Burning in chest and shortness of breath

EXAM:
CHEST - 2 VIEW

[chest pa]
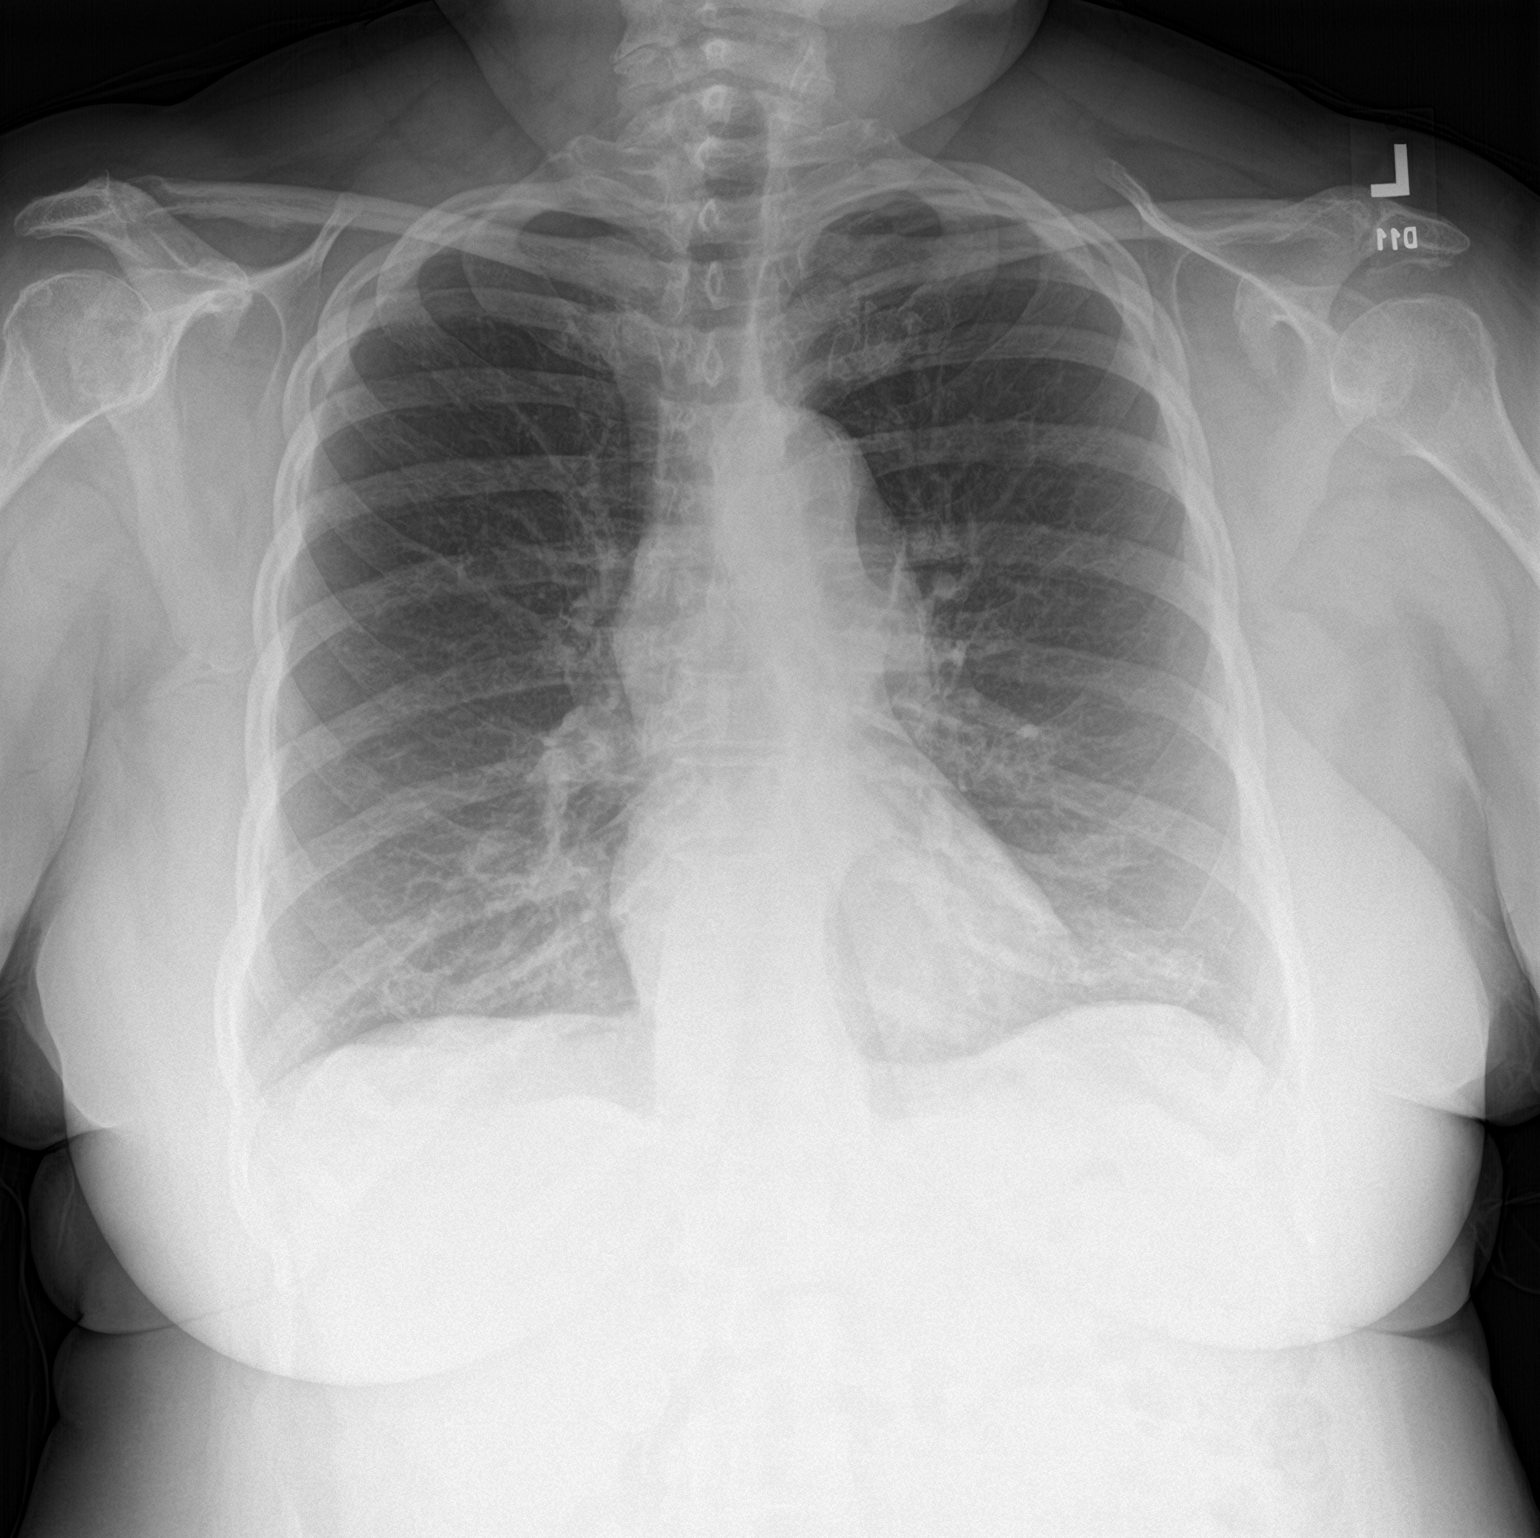

[chest lat]
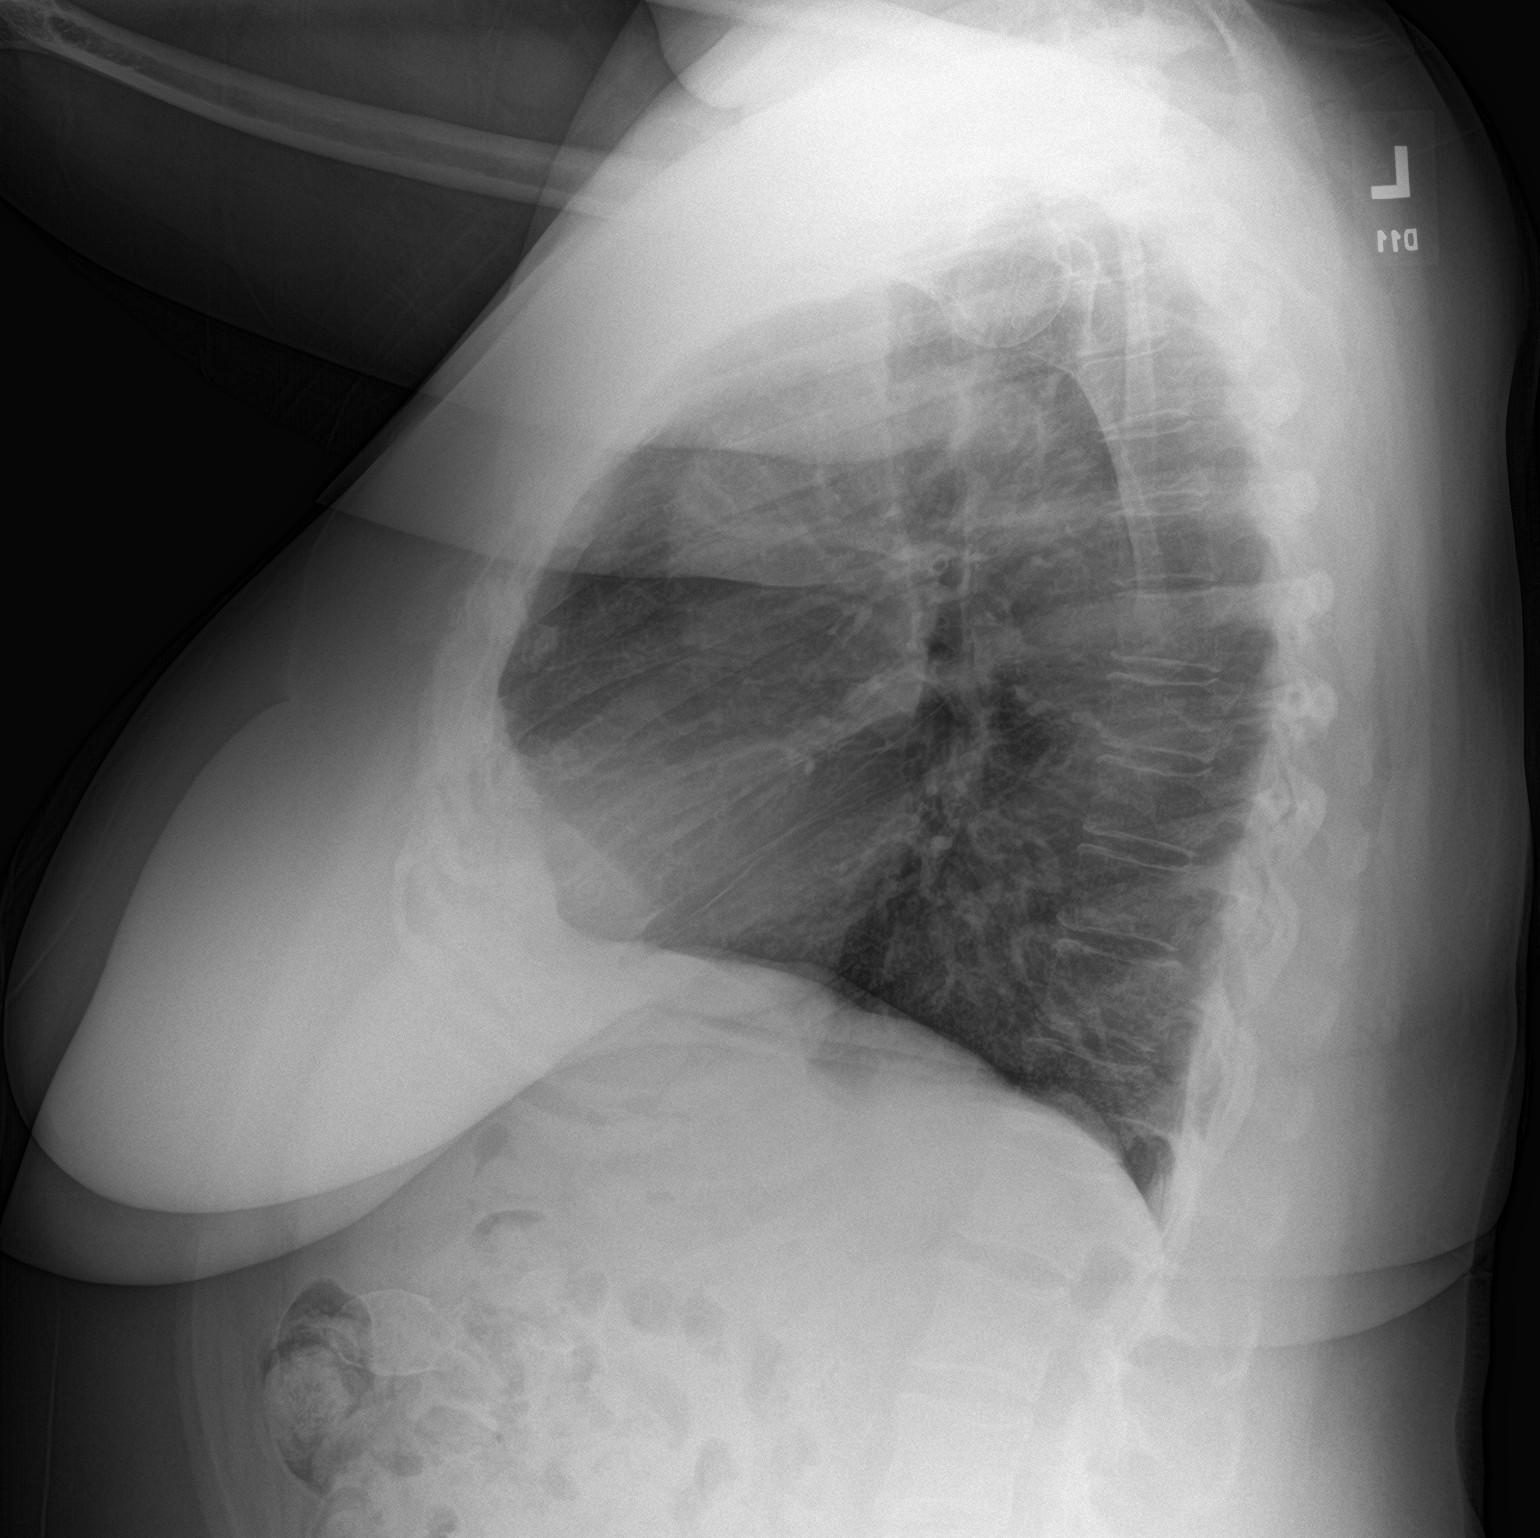

[2 of 2 positions shown; findings below may reference images not displayed]

FINDINGS: Cardiac and mediastinal contours are within normal limits. No focal
pulmonary opacity. No pleural effusion or pneumothorax. No acute
osseous abnormality. Round lucency overlying left anterior heart.
IMPRESSION: 1. No acute cardiopulmonary process.
2. Lucency overlying the left anterior heart, most likely a hiatal
hernia.

## 2022-04-20 ENCOUNTER — Ambulatory Visit: Payer: Medicare Other

## 2022-04-23 ENCOUNTER — Ambulatory Visit: Payer: Self-pay

## 2022-04-23 ENCOUNTER — Ambulatory Visit (INDEPENDENT_AMBULATORY_CARE_PROVIDER_SITE_OTHER): Payer: Medicare Other

## 2022-04-23 DIAGNOSIS — J4540 Moderate persistent asthma, uncomplicated: Secondary | ICD-10-CM

## 2022-05-25 ENCOUNTER — Ambulatory Visit (INDEPENDENT_AMBULATORY_CARE_PROVIDER_SITE_OTHER): Payer: Medicare Other

## 2022-05-25 DIAGNOSIS — J4540 Moderate persistent asthma, uncomplicated: Secondary | ICD-10-CM

## 2022-06-22 ENCOUNTER — Ambulatory Visit: Payer: Medicare Other

## 2022-06-29 ENCOUNTER — Ambulatory Visit (INDEPENDENT_AMBULATORY_CARE_PROVIDER_SITE_OTHER): Payer: Medicare Other

## 2022-06-29 DIAGNOSIS — J4540 Moderate persistent asthma, uncomplicated: Secondary | ICD-10-CM | POA: Diagnosis not present

## 2022-07-27 ENCOUNTER — Ambulatory Visit (INDEPENDENT_AMBULATORY_CARE_PROVIDER_SITE_OTHER): Payer: Medicare Other

## 2022-07-27 DIAGNOSIS — J4540 Moderate persistent asthma, uncomplicated: Secondary | ICD-10-CM

## 2022-07-28 ENCOUNTER — Telehealth: Payer: Self-pay | Admitting: Family Medicine

## 2022-07-28 NOTE — Telephone Encounter (Signed)
Form completed and signed left on rachels desk for pt to pick up

## 2022-07-28 NOTE — Telephone Encounter (Signed)
Patient brought in paperwork to be filled out for handicap placard. Paperwork was given to [place]. Forms have been filled out and patient has been  called to pick up. forms placed at the front desk.

## 2022-07-29 ENCOUNTER — Other Ambulatory Visit: Payer: Self-pay | Admitting: Internal Medicine

## 2022-08-24 ENCOUNTER — Other Ambulatory Visit: Payer: Self-pay

## 2022-08-24 ENCOUNTER — Ambulatory Visit: Payer: Medicare Other | Admitting: Internal Medicine

## 2022-08-24 ENCOUNTER — Encounter: Payer: Self-pay | Admitting: Internal Medicine

## 2022-08-24 ENCOUNTER — Ambulatory Visit: Payer: Medicare Other

## 2022-08-24 VITALS — BP 132/86 | HR 85 | Temp 97.7°F | Resp 16 | Wt 207.7 lb

## 2022-08-24 DIAGNOSIS — J4540 Moderate persistent asthma, uncomplicated: Secondary | ICD-10-CM

## 2022-08-24 MED ORDER — IPRATROPIUM-ALBUTEROL 0.5-2.5 (3) MG/3ML IN SOLN: 3.0000 mL | RESPIRATORY_TRACT | 10 refills | Status: AC | PRN

## 2022-08-24 MED ORDER — METHYLPREDNISOLONE ACETATE 40 MG/ML IJ SUSP
40.0000 mg | Freq: Once | INTRAMUSCULAR | Status: AC
  Administered 2022-08-24: 40 mg via INTRAMUSCULAR

## 2022-08-24 MED ORDER — PREDNISONE 10 MG PO TABS: ORAL_TABLET | ORAL | 1 refills | Status: DC

## 2022-08-24 MED ORDER — MONTELUKAST SODIUM 10 MG PO TABS: 10.0000 mg | ORAL_TABLET | Freq: Every day | ORAL | 1 refills | Status: DC

## 2022-08-24 MED ORDER — SPIRIVA RESPIMAT 1.25 MCG/ACT IN AERS: 2.0000 | INHALATION_SPRAY | Freq: Every day | RESPIRATORY_TRACT | 0 refills | Status: DC

## 2022-08-24 MED ORDER — FAMOTIDINE 20 MG PO TABS: ORAL_TABLET | ORAL | 1 refills | Status: DC

## 2022-08-24 NOTE — Patient Instructions (Addendum)
Asthma-severe persistent, mild flare  Depo 40mg  IM given today  Continue Advair Diskus 500-50 -1 puff every 12 hours to prevent coughing or wheezing- Keep ising name brand only, she failed generic Continue montelukast 10 mg once a day to prevent cough or wheeze Restart Spiriva Respimat 1.25 mcg  -2 puffs every morning to prevent cough and wheeze Use Duonebs with nebulizer  1 unit dose every 4 hours if needed  - New nebulizer given today  Continue Xolair injections 150 mg once every 4 weeks and have access to an epinephrine device. Discussed consideration of Tezspire if asthma control worsens.  This medication would not address hives. For asthma flare, only if needed, begin Prednisone 10 mg tablets. Take 2 tablets once a day for 4 days, then take 1 tablet on the 5th day, and call the clinic. Continue to check your blood sugar daily if you need to begin the prednisone taper.  Allergic rhinitis-controlled Continue Allegra 60 mg- take one tablet once a day as needed for runny nose Continue Nasonex 1 spray per nostril twice a day if needed for stuffy nose Consider nasal saline rinses as needed for nasal symptoms Continue allergen avoidance measures  Allergic conjunctivitis-controlled Continue Optivar eye drops one drop in each eye twice a day as needed for red, itchy eyes.  Reflux-controlled Omeprazole 40 mg-take 1 capsule once a day for reflux Restart famotidine 20mg   Continue lifestyle modifications as listed below  Food allergy-controlled Continue to avoid shellfish.  If you have an allergic reaction give Benadryl 50 mg every 4 hours and if you has life-threatening symptoms inject with EpiPen 0.3 mg  Hives (urticaria)-controlled  Allegra 60 mg twice a day and famotidine (Pepcid) 20 mg twice a day. If no symptoms for 7-14 days then decrease to. Allegra 60 mg twice a day and famotidine (Pepcid) 20 mg once a day.  If no symptoms for 7-14 days then decrease to. Allegra 60 mg  twice a day.   If no symptoms for 7-14 days then decrease to. Allegra 60 mg once a day.  May use Benadryl (diphenhydramine) as needed for breakthrough hives       If symptoms return, then step up dosage   Continue Xolair injections and have access to an epinephrine auto-injector set  Diabetes Keep track of how often you are using prednisone as this can change your blood sugar control.   Call us if you are not doing well on this treatment plan  Continue on your other medications as listed in your chart  Follow up: 6 months (Can schedule with Thurston Hole or myself)   Thank you so much for letting me partake in your care today.  Don't hesitate to reach out if you have any additional concerns!  Ferol Luz, MD  [facility] [place] [place]

## 2022-08-24 NOTE — Progress Notes (Signed)
FOLLOW UP Date of Service/Encounter:  08/24/22   Subjective:  Jennifer Pruitt (DOB: 1951/01/15) is a 72 y.o. female who [facility] in re-evaluation of the following: asthma with acute exacerbation requiring prednisone, allergic rhinitis, allergic conjunctivitis, reflux, food allergy to shellfish, and urticaria History obtained from: chart review and patient.  For Review, LV was on 01/26/22 with Dr.Dennis seen for routine follow-up.  She reported asthma and hives were stable on xolair.  Her FEV1 was 0.58 L showing obstruction and restriction, consistent with previous spirometry.  Last Xolair injection 07/28/22,   Today presents for follow-up. She reports today that her asthma has worsened over the past few days due to rain exposure.  She does not like using the ProAir and prefers nebulized rescue meds.  Has not tried DuoNebs.  She needs a new nebulizer machine.  She ran out of her sRiva which she feels like has contributed to worsening asthma control.  She has a home prescription for prednisone which she is needed twice since last visit.  She is no longer dealing with hives because of the Xolair.  She would like to continue on this medication.  She has had no accidental exposures to shellfish since last visit.  She continues to take omeprazole for her reflux and feels like it was better controlled on famotidine.  She would like to restart this medication.  She continues with her Advair discus 500, 1 puff twice a day.  Montelukast, Spiriva, Allegra and Nasonex as well as Optivar.  She would like refills today.   Allergies as of 08/24/2022       Reactions   Tapazole [methimazole] Swelling, Rash   HIVES, caused lips to swell   Iodine Hives, Swelling   Shellfish Allergy    Wixela Inhub [fluticasone-salmeterol]    Failed trial of        Medication List        Accurate as of Aug 24, 2022 12:55 PM. If you have any questions, ask your  nurse or doctor.          STOP taking these medications    Combivent Respimat 20-100 MCG/ACT Aers respimat Generic drug: Ipratropium-Albuterol Replaced by: ipratropium-albuterol 0.5-2.5 (3) MG/3ML Soln Stopped by: Ferol Luz, MD       TAKE these medications    albuterol (2.5 MG/3ML) 0.083% nebulizer solution Commonly known as: PROVENTIL Take 3 mLs (2.5 mg total) by nebulization every 4 (four) hours as needed for wheezing or shortness of breath. What changed: Another medication with the same name was removed. Continue taking this medication, and follow the directions you see here. Changed by: Ferol Luz, MD   azelastine 0.05 % ophthalmic solution Commonly known as: OPTIVAR Place 1 drop into both eyes 2 (two) times daily as needed.   cetirizine 10 MG tablet Commonly known as: ZyrTEC Allergy Take 1 tablet (10 mg total) by mouth 2 (two) times daily as needed for allergies (Can use an extra dose during flare ups).   EPINEPHrine 0.3 mg/0.3 mL Soaj injection Commonly known as: EpiPen 2-Pak Use as directed for severe allergic reactions   famotidine 20 MG tablet Commonly known as: PEPCID One tablet twice a day.   fexofenadine 60 MG tablet Commonly known as: ALLEGRA Take 1 tablet (60 mg total) by mouth daily as needed for allergies or rhinitis.   furosemide 40 MG tablet Commonly known as: LASIX 40 mg.   glimepiride 4 MG tablet Commonly known as: AMARYL 4  mg.   Hematinic Plus Vit/Minerals 106-1 MG Tabs Take by mouth.   ipratropium-albuterol 0.5-2.5 (3) MG/3ML Soln Commonly known as: DUONEB Take 3 mLs by nebulization every 4 (four) hours as needed. Replaces: Combivent Respimat 20-100 MCG/ACT Aers respimat Started by: Ferol Luz, MD   IRON PO Take by mouth.   levothyroxine 125 MCG tablet Commonly known as: SYNTHROID TAKE 1 TABLET BY MOUTH DAILY   metFORMIN 500 MG tablet Commonly known as: GLUCOPHAGE Take 500 mg by mouth.   mometasone 50  MCG/ACT nasal spray Commonly known as: NASONEX Place 1 spray into the nose 2 (two) times daily as needed.   montelukast 10 MG tablet Commonly known as: SINGULAIR Take 1 tablet (10 mg total) by mouth at bedtime.   omeprazole 40 MG capsule Commonly known as: PRILOSEC TAKE 1 CAPSULE BY MOUTH ONCE DAILY FOR ACID REFLUX   predniSONE 10 MG tablet Commonly known as: DELTASONE Take 2 tablets once a day for 4 days, then take 1 tablet on the 5th day, Started by: Ferol Luz, MD   Spiriva Respimat 1.25 MCG/ACT Aers Generic drug: Tiotropium Bromide Monohydrate Inhale 2 each into the lungs daily. What changed: Another medication with the same name was removed. Continue taking this medication, and follow the directions you see here. Changed by: Ferol Luz, MD   Wixela Inhub 500-50 MCG/ACT Aepb Generic drug: fluticasone-salmeterol INHALE 1 PUFF INTO THE LUNGS IN THE MORNING AND AT BEDTIME   Xolair 150 MG injection Generic drug: omalizumab INJECT 150MG  SUBCUTANEOUSLY EVERY 4 WEEKS       Past Medical History:  Diagnosis Date   Allergic rhinitis    Asthma    Hyperthyroidism    Shellfish allergy    No past surgical history on file. Otherwise, there have been no changes to her past medical history, surgical history, family history, or social history.  ROS: All others negative except as noted per HPI.   Objective:  BP 132/86 (BP Location: Left Arm, Patient Position: Sitting, Cuff Size: Normal)   Pulse 85   Temp 97.7 F (36.5 C) (Temporal)   Resp 16   Wt 207 lb 11.2 oz (94.2 kg)   SpO2 94%   BMI 39.24 kg/m  Body mass index is 39.24 kg/m. Physical Exam: General Appearance:  Alert, cooperative, no distress, appears stated age  Head:  Normocephalic, without obvious abnormality, atraumatic  Eyes:  Conjunctiva clear, EOM's intact  Nose: Nares normal, hypertrophic turbinates, normal mucosa, no visible anterior polyps, and septum midline  Throat: Lips, tongue normal; teeth  and gums normal, normal posterior oropharynx  Neck: Supple, symmetrical  Lungs:   Wheezing in left upper lobe , Respirations unlabored, no coughing  Heart:  regular rate and rhythm and no murmur, Appears well perfused  Extremities: No edema  Skin: Skin color, texture, turgor normal, no rashes or lesions on visualized portions of skin  Neurologic: No gross deficits  Spirometry:  None done  Assessment/Plan   Asthma-severe persistent, mild flare  Depo 40mg  IM given today  Continue Advair Diskus 500-50 -1 puff every 12 hours to prevent coughing or wheezing- Keep ising name brand only, she failed generic Continue montelukast 10 mg once a day to prevent cough or wheeze Restart Spiriva Respimat 1.25 mcg  -2 puffs every morning to prevent cough and wheeze Use Duonebs with nebulizer  1 unit dose every 4 hours if needed  - New nebulizer given today  Continue Xolair injections 150 mg once every 4 weeks and have access to an epinephrine  device. Discussed consideration of Tezspire if asthma control worsens.  This medication would not address hives. For asthma flare, only if needed, begin Prednisone 10 mg tablets. Take 2 tablets once a day for 4 days, then take 1 tablet on the 5th day, and call the clinic. Continue to check your blood sugar daily if you need to begin the prednisone taper.  Allergic rhinitis-controlled Continue Allegra 60 mg- take one tablet once a day as needed for runny nose Continue Nasonex 1 spray per nostril twice a day if needed for stuffy nose Consider nasal saline rinses as needed for nasal symptoms Continue allergen avoidance measures  Allergic conjunctivitis-controlled Continue Optivar eye drops one drop in each eye twice a day as needed for red, itchy eyes.  Reflux-controlled Omeprazole 40 mg-take 1 capsule once a day for reflux Restart famotidine 20mg   Continue lifestyle modifications as listed below  Food allergy-controlled Continue to avoid shellfish.  If you have  an allergic reaction give Benadryl 50 mg every 4 hours and if you has life-threatening symptoms inject with EpiPen 0.3 mg  Hives (urticaria)-controlled  Allegra 60 mg twice a day and famotidine (Pepcid) 20 mg twice a day. If no symptoms for 7-14 days then decrease to. Allegra 60 mg twice a day and famotidine (Pepcid) 20 mg once a day.  If no symptoms for 7-14 days then decrease to. Allegra 60 mg  twice a day.  If no symptoms for 7-14 days then decrease to. Allegra 60 mg once a day.  May use Benadryl (diphenhydramine) as needed for breakthrough hives       If symptoms return, then step up dosage   Continue Xolair injections and have access to an epinephrine auto-injector set  Diabetes Keep track of how often you are using prednisone as this can change your blood sugar control.   Call us if you are not doing well on this treatment plan  Continue on your other medications as listed in your chart  Follow up: 6 months (Can schedule with Thurston Hole or myself)   Thank you so much for letting me partake in your care today.  Don't hesitate to reach out if you have any additional concerns!  Ferol Luz, MD  [facility] [place], [place]

## 2022-09-01 ENCOUNTER — Other Ambulatory Visit: Payer: Self-pay | Admitting: Family Medicine

## 2022-09-21 ENCOUNTER — Ambulatory Visit (INDEPENDENT_AMBULATORY_CARE_PROVIDER_SITE_OTHER): Payer: Medicare Other

## 2022-09-21 ENCOUNTER — Other Ambulatory Visit: Payer: Self-pay

## 2022-09-21 DIAGNOSIS — J4540 Moderate persistent asthma, uncomplicated: Secondary | ICD-10-CM | POA: Diagnosis not present

## 2022-09-21 MED ORDER — SPIRIVA RESPIMAT 1.25 MCG/ACT IN AERS: 2.0000 | INHALATION_SPRAY | Freq: Every day | RESPIRATORY_TRACT | 0 refills | Status: DC

## 2022-09-28 ENCOUNTER — Other Ambulatory Visit: Payer: Self-pay

## 2022-10-04 ENCOUNTER — Other Ambulatory Visit: Payer: Self-pay

## 2022-10-04 MED ORDER — SPIRIVA RESPIMAT 1.25 MCG/ACT IN AERS: 2.0000 | INHALATION_SPRAY | Freq: Every day | RESPIRATORY_TRACT | 1 refills | Status: DC

## 2022-10-19 ENCOUNTER — Ambulatory Visit (INDEPENDENT_AMBULATORY_CARE_PROVIDER_SITE_OTHER): Payer: Medicare Other

## 2022-10-19 DIAGNOSIS — J4540 Moderate persistent asthma, uncomplicated: Secondary | ICD-10-CM

## 2022-11-16 ENCOUNTER — Ambulatory Visit: Payer: Medicare Other

## 2022-11-19 ENCOUNTER — Ambulatory Visit: Payer: Medicare Other

## 2022-11-24 ENCOUNTER — Ambulatory Visit (INDEPENDENT_AMBULATORY_CARE_PROVIDER_SITE_OTHER): Payer: Medicare Other

## 2022-11-24 DIAGNOSIS — J4550 Severe persistent asthma, uncomplicated: Secondary | ICD-10-CM | POA: Diagnosis not present

## 2022-11-24 DIAGNOSIS — J4541 Moderate persistent asthma with (acute) exacerbation: Secondary | ICD-10-CM | POA: Diagnosis not present

## 2022-11-24 DIAGNOSIS — J45998 Other asthma: Secondary | ICD-10-CM | POA: Diagnosis not present

## 2022-11-24 DIAGNOSIS — J4540 Moderate persistent asthma, uncomplicated: Secondary | ICD-10-CM

## 2022-11-24 DIAGNOSIS — J4551 Severe persistent asthma with (acute) exacerbation: Secondary | ICD-10-CM | POA: Diagnosis not present

## 2022-12-22 ENCOUNTER — Ambulatory Visit (INDEPENDENT_AMBULATORY_CARE_PROVIDER_SITE_OTHER): Payer: Medicare Other

## 2022-12-22 DIAGNOSIS — J4540 Moderate persistent asthma, uncomplicated: Secondary | ICD-10-CM | POA: Diagnosis not present

## 2022-12-25 DIAGNOSIS — J4550 Severe persistent asthma, uncomplicated: Secondary | ICD-10-CM | POA: Diagnosis not present

## 2022-12-25 DIAGNOSIS — J45998 Other asthma: Secondary | ICD-10-CM | POA: Diagnosis not present

## 2022-12-25 DIAGNOSIS — J4551 Severe persistent asthma with (acute) exacerbation: Secondary | ICD-10-CM | POA: Diagnosis not present

## 2022-12-25 DIAGNOSIS — J4541 Moderate persistent asthma with (acute) exacerbation: Secondary | ICD-10-CM | POA: Diagnosis not present

## 2023-01-19 ENCOUNTER — Ambulatory Visit: Payer: Medicare Other

## 2023-01-19 DIAGNOSIS — J4540 Moderate persistent asthma, uncomplicated: Secondary | ICD-10-CM | POA: Diagnosis not present

## 2023-01-25 ENCOUNTER — Other Ambulatory Visit: Payer: Self-pay | Admitting: Internal Medicine

## 2023-01-25 NOTE — Telephone Encounter (Signed)
PT NEEDS VISIT ONE MONTH SUPPLY ONLY

## 2023-02-16 ENCOUNTER — Ambulatory Visit: Payer: Medicare Other

## 2023-02-16 ENCOUNTER — Ambulatory Visit: Payer: Medicare Other | Admitting: Internal Medicine

## 2023-02-16 ENCOUNTER — Encounter: Payer: Self-pay | Admitting: Internal Medicine

## 2023-02-16 VITALS — BP 132/76 | HR 98 | Temp 97.7°F | Resp 18 | Wt 200.3 lb

## 2023-02-16 DIAGNOSIS — T7800XD Anaphylactic reaction due to unspecified food, subsequent encounter: Secondary | ICD-10-CM

## 2023-02-16 DIAGNOSIS — L508 Other urticaria: Secondary | ICD-10-CM

## 2023-02-16 DIAGNOSIS — K219 Gastro-esophageal reflux disease without esophagitis: Secondary | ICD-10-CM

## 2023-02-16 DIAGNOSIS — J3089 Other allergic rhinitis: Secondary | ICD-10-CM

## 2023-02-16 DIAGNOSIS — J4550 Severe persistent asthma, uncomplicated: Secondary | ICD-10-CM

## 2023-02-16 DIAGNOSIS — E1169 Type 2 diabetes mellitus with other specified complication: Secondary | ICD-10-CM

## 2023-02-16 DIAGNOSIS — J4540 Moderate persistent asthma, uncomplicated: Secondary | ICD-10-CM

## 2023-02-16 MED ORDER — FLUTICASONE-SALMETEROL 500-50 MCG/ACT IN AEPB: 1.0000 | INHALATION_SPRAY | Freq: Two times a day (BID) | RESPIRATORY_TRACT | 1 refills | Status: AC

## 2023-02-16 MED ORDER — EPINEPHRINE 0.3 MG/0.3ML IJ SOAJ: INTRAMUSCULAR | 1 refills | Status: DC

## 2023-02-16 MED ORDER — MONTELUKAST SODIUM 10 MG PO TABS: 10.0000 mg | ORAL_TABLET | Freq: Every day | ORAL | 1 refills | Status: DC

## 2023-02-16 MED ORDER — MOMETASONE FUROATE 50 MCG/ACT NA SUSP: 1.0000 | Freq: Two times a day (BID) | NASAL | 1 refills | Status: AC | PRN

## 2023-02-16 MED ORDER — FAMOTIDINE 20 MG PO TABS: ORAL_TABLET | ORAL | 1 refills | Status: DC

## 2023-02-16 MED ORDER — SPIRIVA RESPIMAT 1.25 MCG/ACT IN AERS: 2.0000 | INHALATION_SPRAY | Freq: Every day | RESPIRATORY_TRACT | 2 refills | Status: DC

## 2023-02-16 NOTE — Progress Notes (Signed)
FOLLOW UP Date of Service/Encounter:  02/16/23   Subjective:  Jennifer Pruitt (DOB: 1950/12/05) is a 72 y.o. female who [facility] in re-evaluation of the following: asthma with acute exacerbation requiring prednisone, allergic rhinitis, allergic conjunctivitis, reflux, food allergy to shellfish, and urticaria History obtained from: chart review and patient.  For Review, LV was on 08/24/22 with Dr. Marlynn Perking seen for routine follow-up.   She reported hives were stable on xolair, asthma was not well controlled. Treated with depo 40mg  IM and restarted spiriva  Last Xolair injection 01/19/23  Today presents for follow-up. Due to confusion with scheduling patient had to leave appointment 10 minutes prior to appointment time.  History was limited to this.  She reported she had been doing well however spirometry showed persistent obstruction.  Unclear if there is effort attempted to reverse her but she did not time to stay for post.  She is very disgruntled that her biologic injection appointment and her clinic appointment were scheduled an hour apart and she could not stay for the visit.  We did discuss briefly adding on an asthma biologic such as tezspire and information given to her.  She said she went to see if insurance covered it and she may consider adding it on.  She is no longer dealing with hives because of the Xolair.  She would like to continue on this medication.  She has had no accidental exposures to shellfish since last visit.  She continues with her Advair discus 500, 1 puff twice a day.  Montelukast, Spiriva, Allegra and Nasonex as well as Optivar.  She would like refills today.   Allergies as of 02/16/2023       Reactions   Tapazole [methimazole] Swelling, Rash   HIVES, caused lips to swell   Iodine Hives, Swelling   Shellfish Allergy    Wixela Inhub [fluticasone-salmeterol]    Failed trial of        Medication List         Accurate as of February 16, 2023  3:02 PM. If you have any questions, ask your nurse or doctor.          STOP taking these medications    predniSONE 10 MG tablet Commonly known as: DELTASONE       TAKE these medications    albuterol (2.5 MG/3ML) 0.083% nebulizer solution Commonly known as: PROVENTIL Take 3 mLs (2.5 mg total) by nebulization every 4 (four) hours as needed for wheezing or shortness of breath.   azelastine 0.05 % ophthalmic solution Commonly known as: OPTIVAR Place 1 drop into both eyes 2 (two) times daily as needed.   cetirizine 10 MG tablet Commonly known as: ZyrTEC Allergy Take 1 tablet (10 mg total) by mouth 2 (two) times daily as needed for allergies (Can use an extra dose during flare ups).   EPINEPHrine 0.3 mg/0.3 mL Soaj injection Commonly known as: EpiPen 2-Pak Use as directed for severe allergic reactions   famotidine 20 MG tablet Commonly known as: PEPCID One tablet twice a day.   fexofenadine 60 MG tablet Commonly known as: ALLEGRA Take 1 tablet (60 mg total) by mouth daily as needed for allergies or rhinitis.   fluticasone-salmeterol 500-50 MCG/ACT Aepb Commonly known as: Wixela Inhub Inhale 1 puff into the lungs 2 (two) times daily. in the morning and at bedtime.   furosemide 40 MG tablet Commonly known as: LASIX 40 mg.   glimepiride 4 MG tablet Commonly known  as: AMARYL 4 mg.   Hematinic Plus Vit/Minerals 106-1 MG Tabs Take by mouth.   ipratropium-albuterol 0.5-2.5 (3) MG/3ML Soln Commonly known as: DUONEB Take 3 mLs by nebulization every 4 (four) hours as needed.   IRON PO Take by mouth.   levothyroxine 125 MCG tablet Commonly known as: SYNTHROID TAKE 1 TABLET BY MOUTH DAILY   metFORMIN 500 MG tablet Commonly known as: GLUCOPHAGE Take 500 mg by mouth.   mometasone 50 MCG/ACT nasal spray Commonly known as: NASONEX Place 1 spray into the nose 2 (two) times daily as needed.   montelukast 10 MG  tablet Commonly known as: SINGULAIR Take 1 tablet (10 mg total) by mouth at bedtime. What changed: See the new instructions.   omeprazole 40 MG capsule Commonly known as: PRILOSEC TAKE 1 CAPSULE BY MOUTH ONCE DAILY FOR ACID REFLUX   Spiriva Respimat 1.25 MCG/ACT Aers Generic drug: Tiotropium Bromide Monohydrate Inhale 2 each into the lungs daily.   Xolair 150 MG injection Generic drug: omalizumab INJECT 150MG  SUBCUTANEOUSLY  EVERY 4 WEEKS       Past Medical History:  Diagnosis Date   Allergic rhinitis    Asthma    Hyperthyroidism    Shellfish allergy    No past surgical history on file. Otherwise, there have been no changes to her past medical history, surgical history, family history, or social history.  ROS: All others negative except as noted per HPI.   Objective:  BP 132/76 (BP Location: Left Arm, Patient Position: Sitting, Cuff Size: Large)   Pulse 98   Temp 97.7 F (36.5 C) (Temporal)   Resp 18   Wt 200 lb 4.8 oz (90.9 kg)   SpO2 96%   BMI 37.85 kg/m  Body mass index is 37.85 kg/m. Physical Exam: General Appearance:  Alert, cooperative, no distress, appears stated age  Head:  Normocephalic, without obvious abnormality, atraumatic  Eyes:  Conjunctiva clear, EOM's intact  Nose: Nares normal, hypertrophic turbinates, normal mucosa, no visible anterior polyps, and septum midline  Throat: Lips, tongue normal; teeth and gums normal, normal posterior oropharynx  Neck: Supple, symmetrical  Lungs:   Wheezing in left upper lobe , Respirations unlabored, no coughing  Heart:  regular rate and rhythm and no murmur, Appears well perfused  Extremities: No edema  Skin: Skin color, texture, turgor normal, no rashes or lesions on visualized portions of skin  Neurologic: No gross deficits  Spirometry:  Tracings reviewed. Her effort: Good reproducible efforts. FVC: 0.90L FEV1: 0.54L, 32% predicted FEV1/FVC ratio: 60% Interpretation: Spirometry consistent with mixed  obstructive and restrictive disease. After 4 puffs of albuterol  Please see scanned spirometry results for details.   Assessment/Plan   Asthma-severe persistent, with fixed obstruction  Continue Advair Diskus 500-50 -1 puff every 12 hours to prevent coughing or wheezing- Keep ising name brand only, she failed generic Continue montelukast 10 mg once a day to prevent cough or wheeze Continue  Spiriva Respimat 1.25 mcg  -2 puffs every morning to prevent cough and wheeze Use Duonebs with nebulizer  1 unit dose every 4 hours if needed Information given on tezspire today  Continue Xolair injections 150 mg once every 4 weeks and have access to an epinephrine device. Discussed consideration of Tezspire if asthma control worsens.  This medication would not address hives. For asthma flare, only if needed, begin Prednisone 10 mg tablets. Take 2 tablets once a day for 4 days, then take 1 tablet on the 5th day, and call the clinic. Continue to  check your blood sugar daily if you need to begin the prednisone taper.  Allergic rhinitis-controlled Continue Allegra 60 mg- take one tablet once a day as needed for runny nose Continue Nasonex 1 spray per nostril twice a day if needed for stuffy nose Consider nasal saline rinses as needed for nasal symptoms Continue allergen avoidance measures  Allergic conjunctivitis-controlled Continue Optivar eye drops one drop in each eye twice a day as needed for red, itchy eyes.  Reflux-controlled Omeprazole 40 mg-take 1 capsule once a day for reflux Continue famotidine 20mg   Continue lifestyle modifications as listed below  Food allergy-controlled Continue to avoid shellfish.  If you have an allergic reaction give Benadryl 50 mg every 4 hours and if you has life-threatening symptoms inject with EpiPen 0.3 mg  Hives (urticaria)-controlled  Allegra 60 mg twice a day and famotidine (Pepcid) 20 mg twice a day. If no symptoms for 7-14 days then decrease to. Allegra 60  mg twice a day and famotidine (Pepcid) 20 mg once a day.  If no symptoms for 7-14 days then decrease to. Allegra 60 mg  twice a day.  If no symptoms for 7-14 days then decrease to. Allegra 60 mg once a day.  May use Benadryl (diphenhydramine) as needed for breakthrough hives       If symptoms return, then step up dosage   Continue Xolair injections and have access to an epinephrine auto-injector set  Diabetes Keep track of how often you are using prednisone as this can change your blood sugar control.   Call us if you are not doing well on this treatment plan  Continue on your other medications as listed in your chart  Follow up: 6 months (Can schedule with Thurston Hole or myself)   Thank you so much for letting me partake in your care today.  Don't hesitate to reach out if you have any additional concerns!  Ferol Luz, MD  [facility]- , [place]

## 2023-02-16 NOTE — Patient Instructions (Addendum)
Asthma-severe persistent, with fixed obstruction  Continue Advair Diskus 500-50 -1 puff every 12 hours to prevent coughing or wheezing- Keep ising name brand only, she failed generic Continue montelukast 10 mg once a day to prevent cough or wheeze Continue  Spiriva Respimat 1.25 mcg  -2 puffs every morning to prevent cough and wheeze Use Duonebs with nebulizer  1 unit dose every 4 hours if needed Information given on tezspire today  Continue Xolair injections 150 mg once every 4 weeks and have access to an epinephrine device. Discussed consideration of Tezspire if asthma control worsens.  This medication would not address hives. For asthma flare, only if needed, begin Prednisone 10 mg tablets. Take 2 tablets once a day for 4 days, then take 1 tablet on the 5th day, and call the clinic. Continue to check your blood sugar daily if you need to begin the prednisone taper.  Allergic rhinitis-controlled Continue Allegra 60 mg- take one tablet once a day as needed for runny nose Continue Nasonex 1 spray per nostril twice a day if needed for stuffy nose Consider nasal saline rinses as needed for nasal symptoms Continue allergen avoidance measures  Allergic conjunctivitis-controlled Continue Optivar eye drops one drop in each eye twice a day as needed for red, itchy eyes.  Reflux-controlled Omeprazole 40 mg-take 1 capsule once a day for reflux Continue famotidine 20mg   Continue lifestyle modifications as listed below  Food allergy-controlled Continue to avoid shellfish.  If you have an allergic reaction give Benadryl 50 mg every 4 hours and if you has life-threatening symptoms inject with EpiPen 0.3 mg  Hives (urticaria)-controlled  Allegra 60 mg twice a day and famotidine (Pepcid) 20 mg twice a day. If no symptoms for 7-14 days then decrease to. Allegra 60 mg twice a day and famotidine (Pepcid) 20 mg once a day.  If no symptoms for 7-14 days then decrease to. Allegra 60 mg  twice a day.  If no  symptoms for 7-14 days then decrease to. Allegra 60 mg once a day.  May use Benadryl (diphenhydramine) as needed for breakthrough hives       If symptoms return, then step up dosage   Continue Xolair injections and have access to an epinephrine auto-injector set  Diabetes Keep track of how often you are using prednisone as this can change your blood sugar control.   Call us if you are not doing well on this treatment plan  Continue on your other medications as listed in your chart  Follow up: 6 months (Can schedule with Thurston Hole or myself)   Thank you so much for letting me partake in your care today.  Don't hesitate to reach out if you have any additional concerns!  Ferol Luz, MD  [facility]- [place], [place]

## 2023-02-25 ENCOUNTER — Other Ambulatory Visit: Payer: Self-pay | Admitting: Internal Medicine

## 2023-02-28 ENCOUNTER — Telehealth: Payer: Self-pay | Admitting: *Deleted

## 2023-02-28 MED ORDER — TEZSPIRE 210 MG/1.91ML ~~LOC~~ SOSY: 210.0000 mg | PREFILLED_SYRINGE | SUBCUTANEOUS | 11 refills | Status: AC

## 2023-02-28 NOTE — Telephone Encounter (Signed)
Called patient and advised approval for Tezspire and submit to [facility]. SHe will reamin on XOlair once monthly for [facility] if Ins will pay for both If not she does not want to change. I will be in touch regarding delivery and start for [facility]

## 2023-02-28 NOTE — Telephone Encounter (Signed)
-----   Message from Ferol Luz sent at 02/27/2023  1:01 PM EST ----- Can we start this patient on tezspire for persistent asthma. FEV1 remains at 30%

## 2023-03-01 NOTE — Telephone Encounter (Signed)
Thanks

## 2023-03-09 ENCOUNTER — Other Ambulatory Visit: Payer: Self-pay | Admitting: Internal Medicine

## 2023-03-17 ENCOUNTER — Ambulatory Visit: Payer: Medicare Other

## 2023-03-17 DIAGNOSIS — L508 Other urticaria: Secondary | ICD-10-CM

## 2023-03-17 DIAGNOSIS — L501 Idiopathic urticaria: Secondary | ICD-10-CM | POA: Diagnosis not present

## 2023-04-14 ENCOUNTER — Ambulatory Visit: Payer: Medicare Other

## 2023-04-14 DIAGNOSIS — L501 Idiopathic urticaria: Secondary | ICD-10-CM

## 2023-04-26 ENCOUNTER — Other Ambulatory Visit: Payer: Self-pay | Admitting: Internal Medicine

## 2023-05-12 ENCOUNTER — Ambulatory Visit: Payer: Medicare Other

## 2023-05-12 DIAGNOSIS — L501 Idiopathic urticaria: Secondary | ICD-10-CM

## 2023-06-09 ENCOUNTER — Ambulatory Visit: Payer: Medicare Other

## 2023-06-14 ENCOUNTER — Ambulatory Visit

## 2023-06-14 DIAGNOSIS — L501 Idiopathic urticaria: Secondary | ICD-10-CM | POA: Diagnosis not present

## 2023-07-05 ENCOUNTER — Telehealth: Payer: Self-pay

## 2023-07-05 NOTE — Telephone Encounter (Signed)
 FYI:  [place] [facility]  [phone] they have made several attempts to contact this patient regarding her [place] 150mg  her order #[id number] but can fill her script

## 2023-07-13 ENCOUNTER — Ambulatory Visit

## 2023-07-13 DIAGNOSIS — L501 Idiopathic urticaria: Secondary | ICD-10-CM | POA: Diagnosis not present

## 2023-08-09 ENCOUNTER — Ambulatory Visit

## 2023-08-09 DIAGNOSIS — L501 Idiopathic urticaria: Secondary | ICD-10-CM

## 2023-08-22 ENCOUNTER — Other Ambulatory Visit: Payer: Self-pay | Admitting: Family Medicine

## 2023-09-06 ENCOUNTER — Ambulatory Visit

## 2023-09-06 DIAGNOSIS — L501 Idiopathic urticaria: Secondary | ICD-10-CM

## 2023-09-06 DIAGNOSIS — J4540 Moderate persistent asthma, uncomplicated: Secondary | ICD-10-CM

## 2023-10-04 ENCOUNTER — Ambulatory Visit

## 2023-10-07 ENCOUNTER — Ambulatory Visit

## 2023-10-07 DIAGNOSIS — J4540 Moderate persistent asthma, uncomplicated: Secondary | ICD-10-CM

## 2023-10-18 ENCOUNTER — Other Ambulatory Visit: Payer: Self-pay | Admitting: Internal Medicine

## 2023-10-20 ENCOUNTER — Telehealth: Payer: Self-pay

## 2023-10-20 NOTE — Telephone Encounter (Signed)
 Spoke with Rueben from [place] in reference to order #[id number] to verify address [address] for delivery of Xolair  for [place] to be delivered on 10/25/2023 and will need signature at delivery.

## 2023-11-07 ENCOUNTER — Other Ambulatory Visit: Payer: Self-pay

## 2023-11-07 MED ORDER — SPIRIVA RESPIMAT 1.25 MCG/ACT IN AERS: 2.0000 | INHALATION_SPRAY | Freq: Every day | RESPIRATORY_TRACT | 0 refills | Status: DC

## 2023-11-08 ENCOUNTER — Encounter: Payer: Self-pay | Admitting: Internal Medicine

## 2023-11-08 ENCOUNTER — Ambulatory Visit

## 2023-11-08 ENCOUNTER — Ambulatory Visit: Admitting: Internal Medicine

## 2023-11-08 VITALS — BP 124/50 | HR 86 | Temp 98.1°F | Resp 18

## 2023-11-08 DIAGNOSIS — J4550 Severe persistent asthma, uncomplicated: Secondary | ICD-10-CM

## 2023-11-08 DIAGNOSIS — E1169 Type 2 diabetes mellitus with other specified complication: Secondary | ICD-10-CM

## 2023-11-08 DIAGNOSIS — L501 Idiopathic urticaria: Secondary | ICD-10-CM | POA: Diagnosis not present

## 2023-11-08 DIAGNOSIS — T7800XD Anaphylactic reaction due to unspecified food, subsequent encounter: Secondary | ICD-10-CM

## 2023-11-08 DIAGNOSIS — J4540 Moderate persistent asthma, uncomplicated: Secondary | ICD-10-CM

## 2023-11-08 DIAGNOSIS — K219 Gastro-esophageal reflux disease without esophagitis: Secondary | ICD-10-CM

## 2023-11-08 DIAGNOSIS — J3089 Other allergic rhinitis: Secondary | ICD-10-CM | POA: Diagnosis not present

## 2023-11-08 MED ORDER — SPIRIVA RESPIMAT 1.25 MCG/ACT IN AERS: 2.0000 | INHALATION_SPRAY | Freq: Every day | RESPIRATORY_TRACT | 5 refills | Status: AC

## 2023-11-08 MED ORDER — MONTELUKAST SODIUM 10 MG PO TABS: 10.0000 mg | ORAL_TABLET | Freq: Every day | ORAL | 2 refills | Status: AC

## 2023-11-08 MED ORDER — FAMOTIDINE 20 MG PO TABS: ORAL_TABLET | ORAL | 1 refills | Status: AC

## 2023-11-08 MED ORDER — EPINEPHRINE 0.3 MG/0.3ML IJ SOAJ: INTRAMUSCULAR | 1 refills | Status: AC

## 2023-11-08 NOTE — Patient Instructions (Addendum)
 Asthma-severe persistent, with fixed obstruction, well controlled  Continue Advair Diskus 500-50 -1 puff every 12 hours to prevent coughing or wheezing- Keep ising name brand only, she failed generic Continue montelukast  10 mg once a day to prevent cough or wheeze Continue  Spiriva  Respimat 1.25 mcg  -2 puffs every morning to prevent cough and wheeze Use Duonebs with nebulizer  1 unit dose every 4 hours if needed Continue Xolair  injections 150 mg once every 4 weeks and have access to an epinephrine  device. For asthma flare, only if needed, begin Prednisone  10 mg tablets. Take 2 tablets once a day for 4 days, then take 1 tablet on the 5th day, and call the clinic. Continue to check your blood sugar daily if you need to begin the prednisone  taper.  Allergic rhinitis-controlled Continue Allegra  60 mg- take one tablet once a day as needed for runny nose Continue Nasonex  1 spray per nostril twice a day if needed for stuffy nose Consider nasal saline rinses as needed for nasal symptoms Continue allergen avoidance measures  Allergic conjunctivitis-controlled Continue Optivar  eye drops one drop in each eye twice a day as needed for red, itchy eyes.  Reflux-controlled Omeprazole  40 mg-take 1 capsule once a day for reflux Continue famotidine  20mg   Continue lifestyle modifications as listed below  Food allergy-controlled Continue to avoid shellfish.  If you have an allergic reaction give Benadryl 50 mg every 4 hours and if you has life-threatening symptoms inject with EpiPen  0.3 mg  Hives (urticaria)-controlled  Allegra  60 mg twice a day and famotidine  (Pepcid ) 20 mg twice a day. If no symptoms for 7-14 days then decrease to. Allegra  60 mg twice a day and famotidine  (Pepcid ) 20 mg once a day.  If no symptoms for 7-14 days then decrease to. Allegra  60 mg  twice a day.  If no symptoms for 7-14 days then decrease to. Allegra  60 mg once a day.  May use Benadryl (diphenhydramine) as needed for  breakthrough hives       If symptoms return, then step up dosage   Continue Xolair  injections and have access to an epinephrine  auto-injector set  Diabetes Keep track of how often you are using prednisone  as this can change your blood sugar control.   Call us  if you are not doing well on this treatment plan  Continue on your other medications as listed in your chart  Follow up: 6 months (Can schedule with Arlean or myself)   Thank you so much for letting me partake in your care today.  Don't hesitate to reach out if you have any additional concerns!  Hargis Springer, MD  [facility]- [place], [place]

## 2023-11-08 NOTE — Progress Notes (Signed)
 FOLLOW UP Date of Service/Encounter:  11/08/23   Subjective:  Jennifer Pruitt (DOB: 01-04-1951) is a 73 y.o. female who [facility] in re-evaluation of the following: asthma with acute exacerbation requiring prednisone , allergic rhinitis, allergic conjunctivitis, reflux, food allergy to shellfish, and urticaria History obtained from: chart review and patient.  For Review, LV was on 02/16/23 with Dr. Lorin seen for routine follow-up.   FEV1: 0.54L, 32% predicted  Last Xolair  injection 10/07/22 (150mg )   Discussed the use of AI scribe software for clinical note transcription with the patient, who gave verbal consent to proceed.  History of Present Illness Jennifer Pruitt is a 73 year old female with severe asthma who presents for follow-up on her asthma management.  Asthma symptoms and exacerbating factors - Severe asthma well controlled  - DuoNebs used only during upper respiratory infections to aid breathing - No recent need for prednisone , emergency room or urgent care visits, or antibiotics since last visit - Weather changes, especially hot and humid conditions, exacerbate symptoms and cause dyspnea - Avoids outdoor activities during adverse weather but attended today's appointment despite hot and humid conditions  Cutaneous hypersensitivity reactions - No recent hives or breakthrough pruritus while on Xolair  and famotidine  - Localized hives and irritation at the site of recent pneumonia vaccination, resolved within a couple of days with cold compresses and regular medication  Upper airway allergic symptoms - Upper airway symptoms controlled with Allegra  and Nasonex  - Allegra  preferred over Zyrtec  for symptom control  Gastroesophageal reflux symptoms - Occasional reflux symptoms, well-controlled with medication  Shellfish allergy - Strict avoidance of shellfish due to known allergy - No accidental exposures or allergic  reactions     She continues with her Advair discus 500, 1 puff twice a day.  Montelukast , Spiriva , Allegra  and Nasonex  as well as Optivar .  She would like refills today.   Allergies as of 11/08/2023       Reactions   Tapazole [methimazole] Swelling, Rash   HIVES, caused lips to swell   Iodine Hives, Swelling   Shellfish Allergy    Wixela Inhub [fluticasone -salmeterol]    Failed trial of        Medication List        Accurate as of November 08, 2023 11:41 AM. If you have any questions, ask your nurse or doctor.          albuterol  (2.5 MG/3ML) 0.083% nebulizer solution Commonly known as: PROVENTIL  Take 3 mLs (2.5 mg total) by nebulization every 4 (four) hours as needed for wheezing or shortness of breath.   azelastine  0.05 % ophthalmic solution Commonly known as: OPTIVAR  Place 1 drop into both eyes 2 (two) times daily as needed.   cetirizine  10 MG tablet Commonly known as: ZyrTEC  Allergy Take 1 tablet (10 mg total) by mouth 2 (two) times daily as needed for allergies (Can use an extra dose during flare ups).   EPINEPHrine  0.3 mg/0.3 mL Soaj injection Commonly known as: EpiPen  2-Pak Use as directed for severe allergic reactions   famotidine  20 MG tablet Commonly known as: PEPCID  One tablet twice a day.   fexofenadine  60 MG tablet Commonly known as: ALLEGRA  Take 1 tablet (60 mg total) by mouth daily as needed for allergies or rhinitis.   fluticasone -salmeterol 500-50 MCG/ACT Aepb Commonly known as: Wixela Inhub Inhale 1 puff into the lungs 2 (two) times daily. in the morning and at bedtime.   furosemide 40 MG tablet Commonly  known as: LASIX 40 mg.   glimepiride 4 MG tablet Commonly known as: AMARYL 4 mg.   Hematinic Plus Vit/Minerals 106-1 MG Tabs Take by mouth.   ipratropium-albuterol  0.5-2.5 (3) MG/3ML Soln Commonly known as: DUONEB Take 3 mLs by nebulization every 4 (four) hours as needed.   IRON PO Take by mouth.   levothyroxine 125 MCG  tablet Commonly known as: SYNTHROID TAKE 1 TABLET BY MOUTH DAILY   metFORMIN 500 MG tablet Commonly known as: GLUCOPHAGE Take 500 mg by mouth.   mometasone  50 MCG/ACT nasal spray Commonly known as: NASONEX  Place 1 spray into the nose 2 (two) times daily as needed.   montelukast  10 MG tablet Commonly known as: SINGULAIR  TAKE 1 TABLET BY MOUTH AT  BEDTIME   omeprazole  40 MG capsule Commonly known as: PRILOSEC TAKE 1 CAPSULE BY MOUTH EVERY DAY FOR STOMACH ACID OR REFLUX   Spiriva  Respimat 1.25 MCG/ACT Aers Generic drug: Tiotropium Bromide  Monohydrate Inhale 2 each into the lungs daily. Courtesy, needs visit   Tezspire  210 MG/1. syringe Generic drug: tezepelumab -ekko Inject 1.91 mLs (210 mg total) into the skin every 28 (twenty-eight) days.   Xolair  150 MG injection Generic drug: omalizumab  INJECT 150MG  SUBCUTANEOUSLY  EVERY 4 WEEKS       Past Medical History:  Diagnosis Date   Allergic rhinitis    Asthma    Hyperthyroidism    Shellfish allergy    History reviewed. No pertinent surgical history. Otherwise, there have been no changes to her past medical history, surgical history, family history, or social history.  ROS: All others negative except as noted per HPI.   Objective:  BP (!) 124/50   Pulse 86   Temp 98.1 F (36.7 C) (Temporal)   Resp 18   SpO2 96%  There is no height or weight on file to calculate BMI. Physical Exam: General Appearance:  Alert, cooperative, no distress, appears stated age  Head:  Normocephalic, without obvious abnormality, atraumatic  Eyes:  Conjunctiva clear, EOM's intact  Nose: Nares normal, hypertrophic turbinates, normal mucosa, no visible anterior polyps, and septum midline  Throat: Lips, tongue normal; teeth and gums normal, normal posterior oropharynx  Neck: Supple, symmetrical  Lungs:   clear to auscultation bilaterally, Respirations unlabored, no coughing  Heart:  regular rate and rhythm and no murmur, Appears well  perfused  Extremities: No edema  Skin: Skin color, texture, turgor normal, no rashes or lesions on visualized portions of skin  Neurologic: No gross deficits  Spirometry:  Tracings reviewed. Her effort: Good reproducible efforts. FVC: 1.11L FEV1: 0.59L, 36% predicted FEV1/FVC ratio: 53% Interpretation: Spirometry consistent with mixed obstructive and restrictive disease. - improved from prior  Please see scanned spirometry results for details.   Assessment/Plan   Patient Instructions  Asthma-severe persistent, with fixed obstruction, well controlled  Continue Advair Diskus 500-50 -1 puff every 12 hours to prevent coughing or wheezing- Keep ising name brand only, she failed generic Continue montelukast  10 mg once a day to prevent cough or wheeze Continue  Spiriva  Respimat 1.25 mcg  -2 puffs every morning to prevent cough and wheeze Use Duonebs with nebulizer  1 unit dose every 4 hours if needed Continue Xolair  injections 150 mg once every 4 weeks and have access to an epinephrine  device. For asthma flare, only if needed, begin Prednisone  10 mg tablets. Take 2 tablets once a day for 4 days, then take 1 tablet on the 5th day, and call the clinic. Continue to check your blood sugar daily  if you need to begin the prednisone  taper.  Allergic rhinitis-controlled Continue Allegra  60 mg- take one tablet once a day as needed for runny nose Continue Nasonex  1 spray per nostril twice a day if needed for stuffy nose Consider nasal saline rinses as needed for nasal symptoms Continue allergen avoidance measures  Allergic conjunctivitis-controlled Continue Optivar  eye drops one drop in each eye twice a day as needed for red, itchy eyes.  Reflux-controlled Omeprazole  40 mg-take 1 capsule once a day for reflux Continue famotidine  20mg   Continue lifestyle modifications as listed below  Food allergy-controlled Continue to avoid shellfish.  If you have an allergic reaction give Benadryl 50 mg every  4 hours and if you has life-threatening symptoms inject with EpiPen  0.3 mg  Hives (urticaria)-controlled  Allegra  60 mg twice a day and famotidine  (Pepcid ) 20 mg twice a day. If no symptoms for 7-14 days then decrease to. Allegra  60 mg twice a day and famotidine  (Pepcid ) 20 mg once a day.  If no symptoms for 7-14 days then decrease to. Allegra  60 mg  twice a day.  If no symptoms for 7-14 days then decrease to. Allegra  60 mg once a day.  May use Benadryl (diphenhydramine) as needed for breakthrough hives       If symptoms return, then step up dosage   Continue Xolair  injections and have access to an epinephrine  auto-injector set  Diabetes Keep track of how often you are using prednisone  as this can change your blood sugar control.   Call us  if you are not doing well on this treatment plan  Continue on your other medications as listed in your chart  Follow up: 6 months (Can schedule with Arlean or myself)   Thank you so much for letting me partake in your care today.  Don't hesitate to reach out if you have any additional concerns!  Hargis Springer, MD  [facility] [place], [place]

## 2023-12-06 ENCOUNTER — Ambulatory Visit

## 2023-12-06 DIAGNOSIS — L501 Idiopathic urticaria: Secondary | ICD-10-CM | POA: Diagnosis not present

## 2024-01-03 ENCOUNTER — Ambulatory Visit

## 2024-01-03 DIAGNOSIS — L501 Idiopathic urticaria: Secondary | ICD-10-CM

## 2024-01-16 ENCOUNTER — Telehealth: Payer: Self-pay | Admitting: Internal Medicine

## 2024-01-16 ENCOUNTER — Other Ambulatory Visit: Payer: Self-pay

## 2024-01-16 MED ORDER — OMEPRAZOLE 40 MG PO CPDR: 40.0000 mg | DELAYED_RELEASE_CAPSULE | Freq: Every day | ORAL | 4 refills | Status: AC

## 2024-01-16 MED ORDER — OMEPRAZOLE 40 MG PO CPDR: DELAYED_RELEASE_CAPSULE | ORAL | 1 refills | Status: DC

## 2024-01-16 NOTE — Telephone Encounter (Signed)
 Done

## 2024-01-16 NOTE — Telephone Encounter (Signed)
 Rx sent to [facility] on file. Pt informed.

## 2024-01-16 NOTE — Telephone Encounter (Signed)
Refill sent to [facility]

## 2024-01-16 NOTE — Telephone Encounter (Signed)
 Okay refill sent to [facility].

## 2024-01-16 NOTE — Telephone Encounter (Signed)
 PT REQUEST A REFILL FOR OMEPRAZOLE 

## 2024-01-31 ENCOUNTER — Ambulatory Visit

## 2024-02-02 ENCOUNTER — Ambulatory Visit (INDEPENDENT_AMBULATORY_CARE_PROVIDER_SITE_OTHER)

## 2024-02-02 DIAGNOSIS — L501 Idiopathic urticaria: Secondary | ICD-10-CM

## 2024-02-28 ENCOUNTER — Ambulatory Visit

## 2024-02-28 DIAGNOSIS — L501 Idiopathic urticaria: Secondary | ICD-10-CM

## 2024-03-25 ENCOUNTER — Other Ambulatory Visit: Payer: Self-pay | Admitting: Internal Medicine

## 2024-03-27 ENCOUNTER — Ambulatory Visit (INDEPENDENT_AMBULATORY_CARE_PROVIDER_SITE_OTHER)

## 2024-03-27 DIAGNOSIS — L501 Idiopathic urticaria: Secondary | ICD-10-CM | POA: Diagnosis not present

## 2024-04-24 ENCOUNTER — Ambulatory Visit

## 2024-04-24 ENCOUNTER — Telehealth: Payer: Self-pay

## 2024-04-24 NOTE — Telephone Encounter (Signed)
 Pt needs to sign consent for biologic to continue being shipped to office. Pt is aware and will come up later in week to sign. Understands that medicine will not resume until form returned.

## 2024-04-24 NOTE — Telephone Encounter (Signed)
 Received fax from [facility] advising they have attempted to reach out to patient multiple times to fill xolair . Per Tammy, pt needs to give consent to pharmacy to continue biologic deliveries which patient is aware of and will come up to the office as soon as weather allows. Notice from [facility] scanned into chart.
